# Patient Record
Sex: Female | Born: 1965 | Race: White | Hispanic: No | Smoking: Never smoker
Health system: Southern US, Community
[De-identification: ages and names within clinical notes are randomized; demographics above are authoritative.]

## PROBLEM LIST (undated history)

## (undated) DIAGNOSIS — N189 Chronic kidney disease, unspecified: Secondary | ICD-10-CM

## (undated) DIAGNOSIS — F419 Anxiety disorder, unspecified: Secondary | ICD-10-CM

## (undated) DIAGNOSIS — I639 Cerebral infarction, unspecified: Secondary | ICD-10-CM

## (undated) DIAGNOSIS — I10 Essential (primary) hypertension: Secondary | ICD-10-CM

## (undated) DIAGNOSIS — I2510 Atherosclerotic heart disease of native coronary artery without angina pectoris: Secondary | ICD-10-CM

## (undated) DIAGNOSIS — E669 Obesity, unspecified: Secondary | ICD-10-CM

## (undated) DIAGNOSIS — E119 Type 2 diabetes mellitus without complications: Secondary | ICD-10-CM

## (undated) DIAGNOSIS — I219 Acute myocardial infarction, unspecified: Secondary | ICD-10-CM

## (undated) HISTORY — PX: FISTULOGRAM: SHX5832

## (undated) HISTORY — PX: KIDNEY SURGERY: SHX687

## (undated) HISTORY — PX: PERITONEAL CATHETER INSERTION: SHX2223

## (undated) HISTORY — PX: CORONARY ANGIOPLASTY: SHX604

## (undated) HISTORY — PX: AV FISTULA PLACEMENT: SHX1204

## (undated) HISTORY — PX: CHOLECYSTECTOMY: SHX55

---

## 2008-12-27 HISTORY — PX: CORONARY ARTERY BYPASS GRAFT: SHX141

## 2020-01-25 ENCOUNTER — Other Ambulatory Visit: Payer: Self-pay | Admitting: Nephrology

## 2020-01-25 DIAGNOSIS — D582 Other hemoglobinopathies: Secondary | ICD-10-CM

## 2020-02-01 ENCOUNTER — Ambulatory Visit
Admission: RE | Admit: 2020-02-01 | Discharge: 2020-02-01 | Disposition: A | Discharge status: Home / Self Care | Payer: Medicare Other | Source: Ambulatory Visit | Attending: Nephrology | Admitting: Nephrology

## 2020-02-01 ENCOUNTER — Other Ambulatory Visit: Payer: Self-pay

## 2020-02-01 DIAGNOSIS — N186 End stage renal disease: Secondary | ICD-10-CM | POA: Diagnosis not present

## 2020-02-01 DIAGNOSIS — D582 Other hemoglobinopathies: Secondary | ICD-10-CM

## 2020-06-09 IMAGING — US US RENAL
1 series · 14 of 25 positions shown · non-contrast
Comparison: None.

CLINICAL DATA: Elevated hemoglobin levels. End-stage renal disease.

EXAM:
RENAL / URINARY TRACT ULTRASOUND COMPLETE

[Series 1: us renal · 0.25mm/px · 14 of 31 slices shown]
[im 1/31]
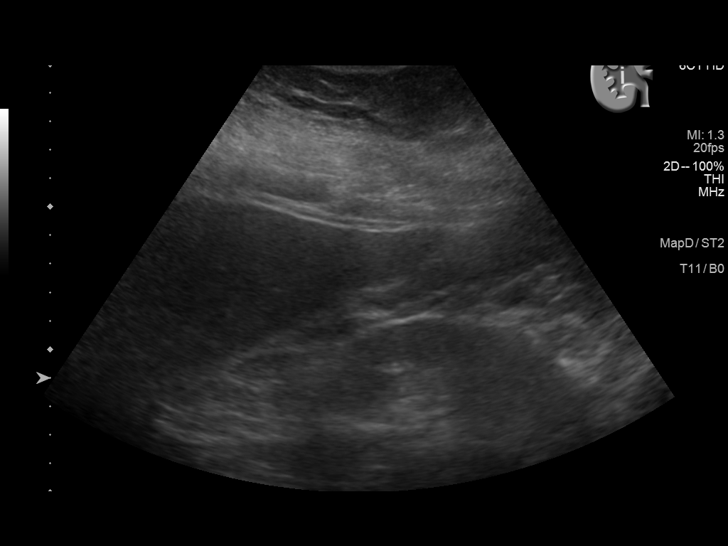
[im 3/31]
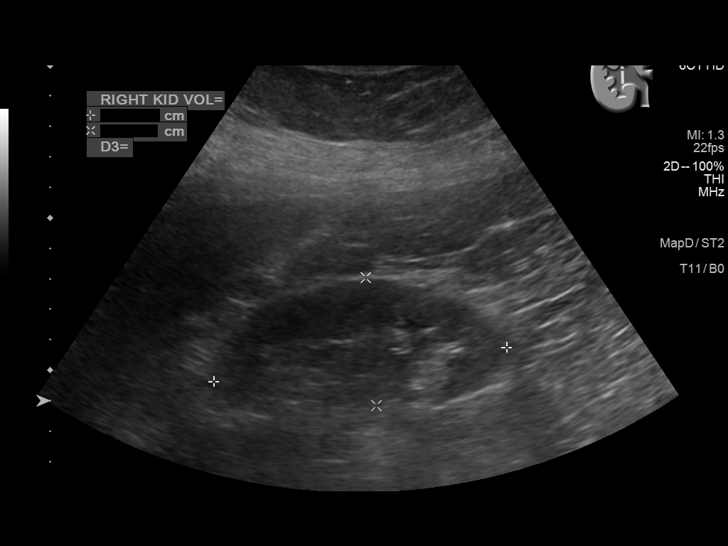
[im 6/31]
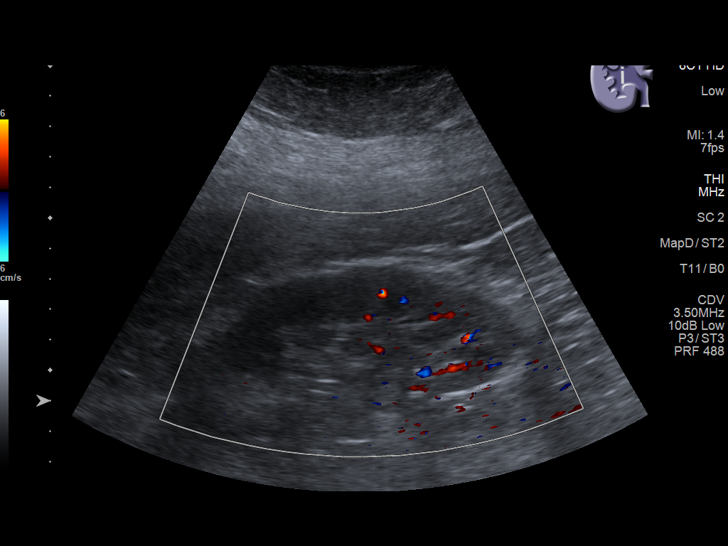
[im 8/31]
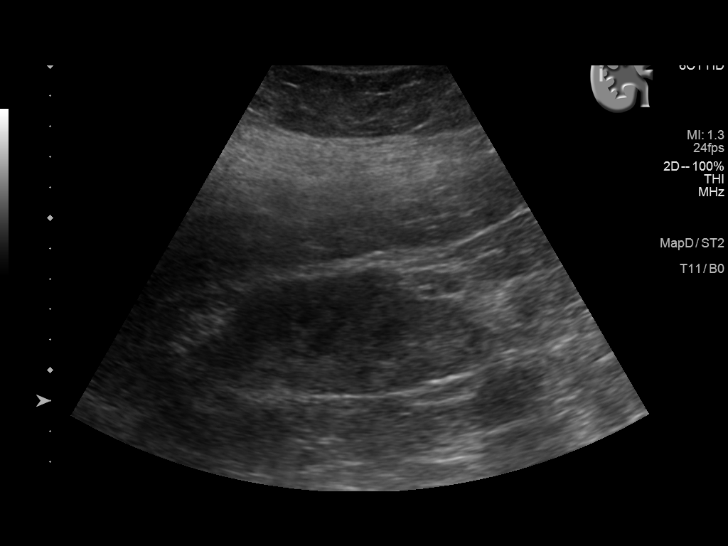
[im 11/31]
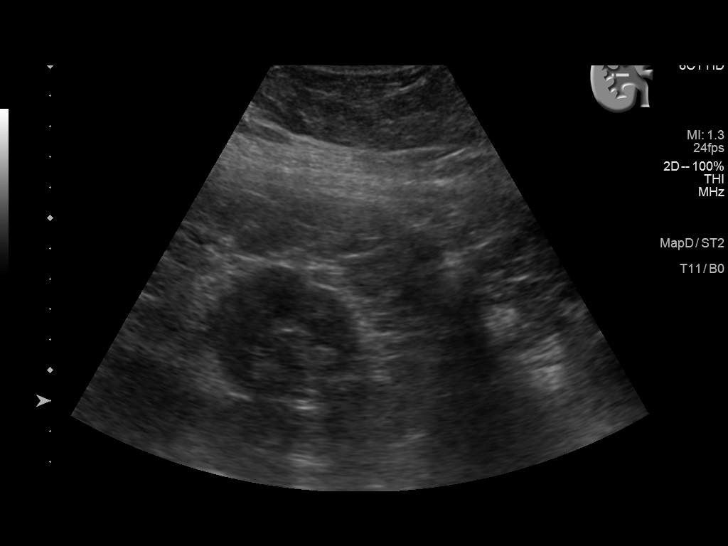
[im 12/31]
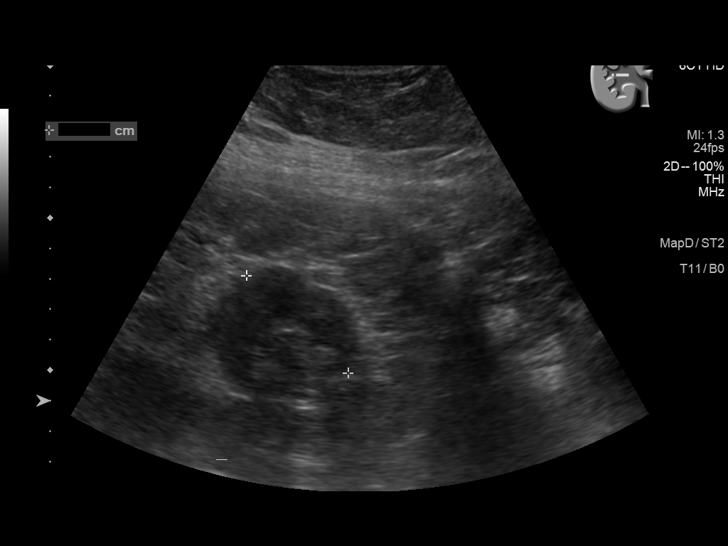
[im 14/31]
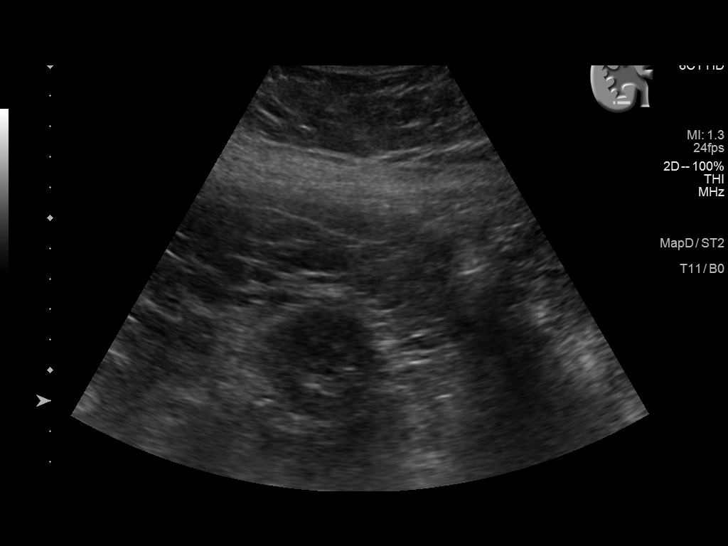
[im 17/31]
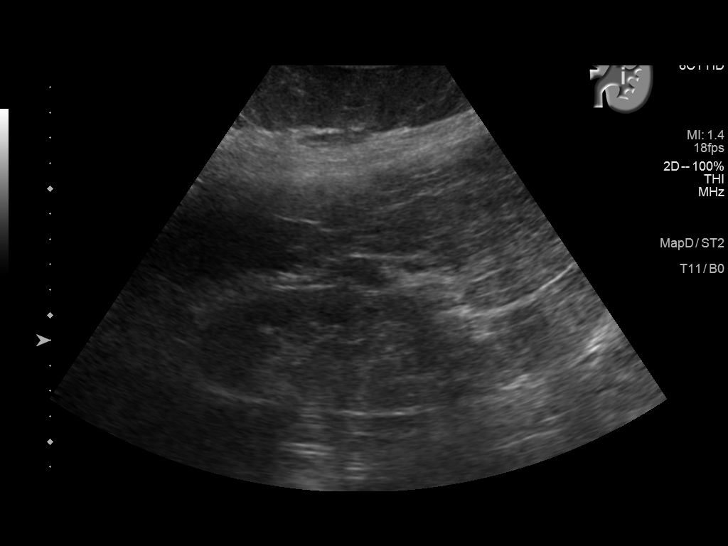
[im 19/31]
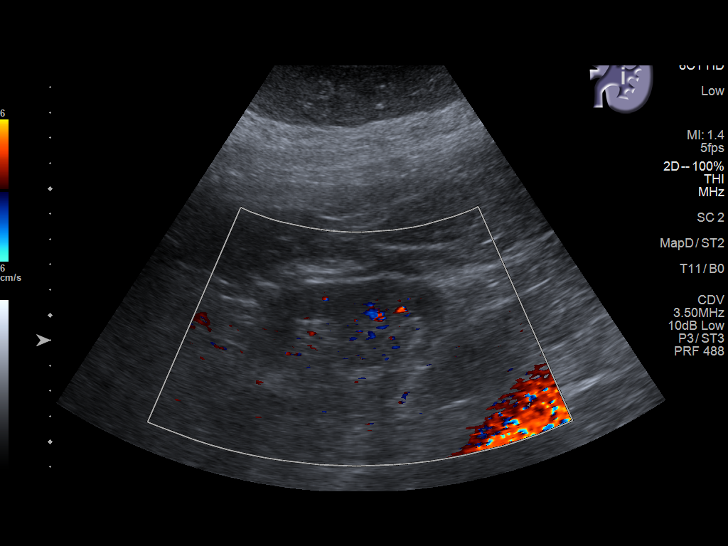
[im 21/31]
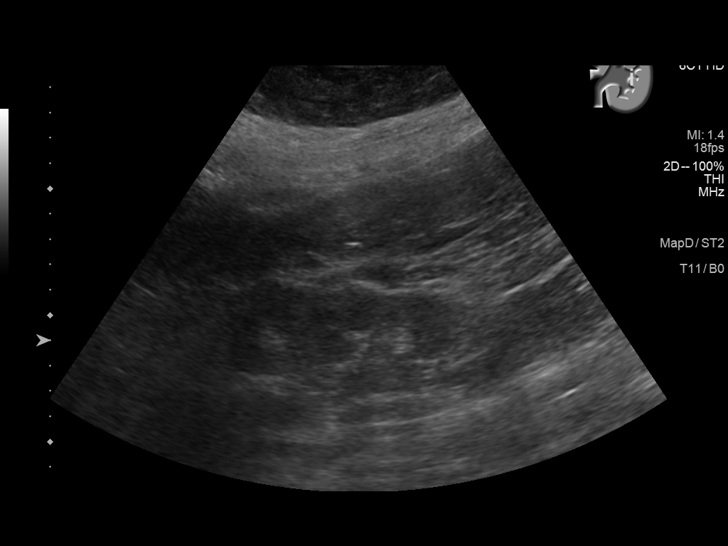
[im 23/31]
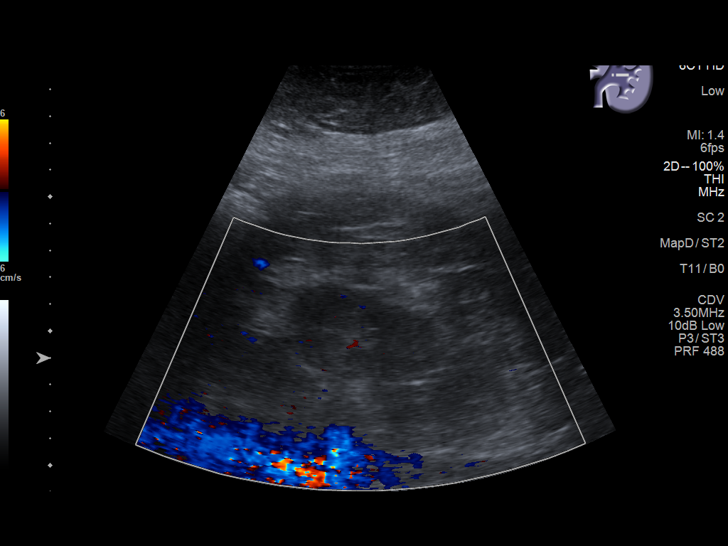
[im 26/31]
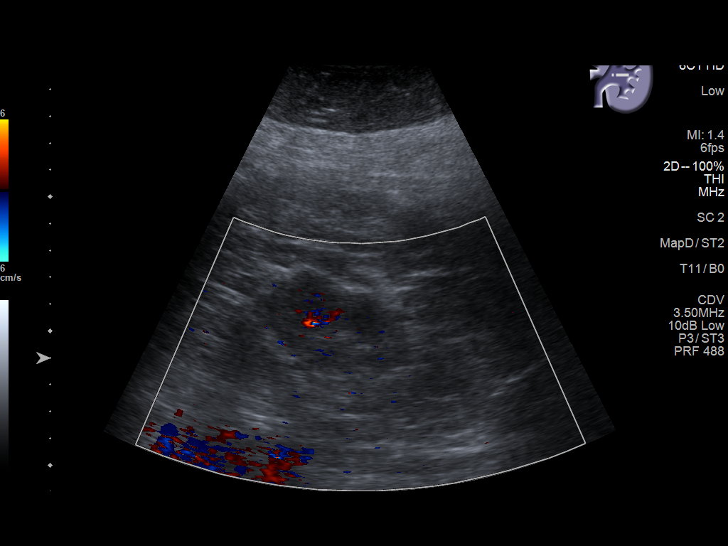
[im 28/31]
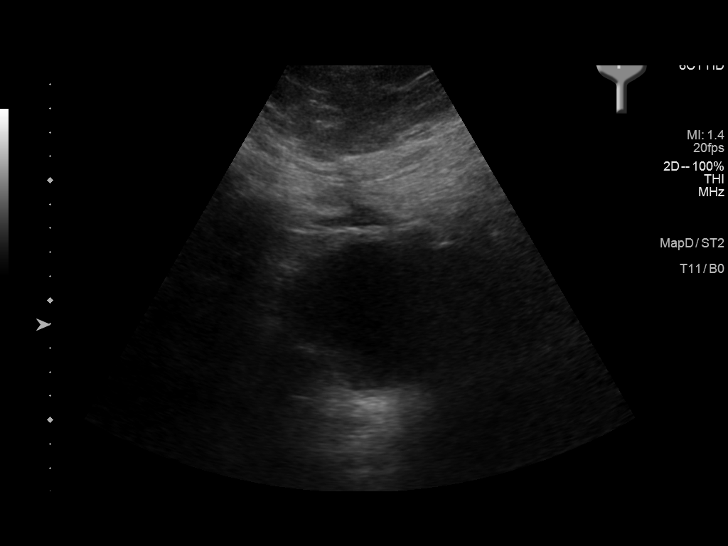
[im 31/31]
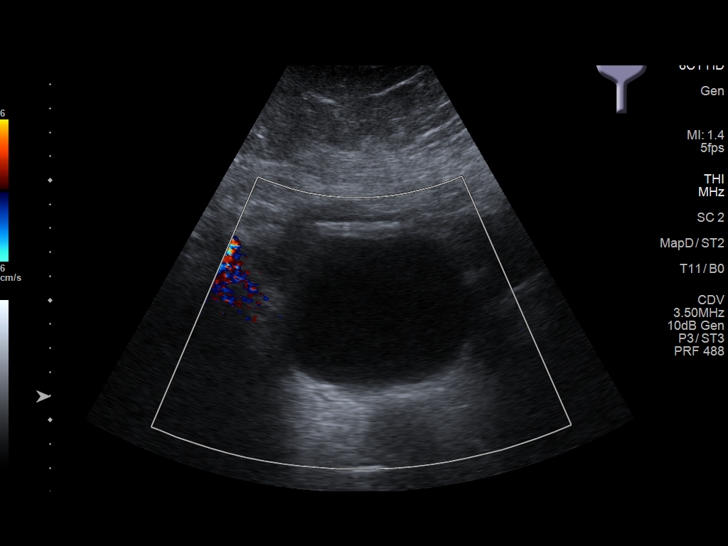

[14 of 25 positions shown; findings below may reference images not displayed]

FINDINGS: Right Kidney:

Renal measurements: 9.7 x 4.2 x 4.6 cm = volume: 99 mL .
Echogenicity within normal limits. No mass or hydronephrosis
visualized.

Left Kidney:

Renal measurements: 10.1 x 4.4 x 3.6 cm = volume: 83 mL.
Echogenicity within normal limits. No mass or hydronephrosis
visualized.

Bladder:

Appears normal for degree of bladder distention. Ureteral jets were
not identified.

Other:

None.
IMPRESSION: Normal appearing kidneys and bladder. Specifically, no renal masses.

## 2021-01-10 ENCOUNTER — Ambulatory Visit
Admission: EM | Admit: 2021-01-10 | Discharge: 2021-01-10 | Disposition: A | Discharge status: Home / Self Care | Payer: Medicare Other | Attending: Family Medicine | Admitting: Family Medicine

## 2021-01-10 ENCOUNTER — Other Ambulatory Visit: Payer: Self-pay

## 2021-01-10 DIAGNOSIS — Z20822 Contact with and (suspected) exposure to covid-19: Secondary | ICD-10-CM | POA: Diagnosis not present

## 2021-01-10 DIAGNOSIS — J069 Acute upper respiratory infection, unspecified: Secondary | ICD-10-CM | POA: Insufficient documentation

## 2021-01-10 HISTORY — DX: Obesity, unspecified: E66.9

## 2021-01-10 MED ORDER — IPRATROPIUM BROMIDE 0.06 % NA SOLN: 2.0000 | Freq: Four times a day (QID) | NASAL | 0 refills | Status: DC | PRN

## 2021-01-10 MED ORDER — AMOXICILLIN-POT CLAVULANATE 875-125 MG PO TABS: 1.0000 | ORAL_TABLET | Freq: Two times a day (BID) | ORAL | 0 refills | Status: DC

## 2021-01-10 NOTE — Discharge Instructions (Signed)
Self isolate until covid results are back.  We will notify you by phone if it is positive. Your negative results will be sent through your MyChart.    If it is positive you need to isolate from others for a total of 5 days. If no fever for 24 hours without medications, and symptoms improving you may end isolation on day 6, but wear a mask if around any others for an additional 5 days.   Push fluids to ensure adequate hydration and keep secretions thin.  Tylenol as needed for headache.  Nasal spray as provided as needed.  Hold antibiotic provided- if your covid is negative and persistent or worsening symptoms early next week you may fill this and complete.  Please return for any worsening of symptoms- shortness of breath , chest pain or fevers.

## 2021-01-10 NOTE — ED Provider Notes (Signed)
[facility]    CSN: [id number] Arrival date & time: 01/10/21  9678      History   Chief Complaint Chief Complaint  Patient presents with  . Cough  . Bodyaches  . Headache    HPI Tiffany Holt is a 55 y.o. female.   Tiffany Holt presents with her sone with complaints of sneezing. Headaches. body aches. Fatigue. Fevers. Started 1 week ago. Worsening. Congestion, sneezing and headache are worse. Fevers have been subjective. No shortness of breath . No chest pain . Cough is non productive. Hasn't taken any medications for symptoms. She has been around her niece who has had a URI, although not tested for covid-19. No other specific known exposures to covid-19.  Nausea and diarrhea nitially- this has resolved. Has never had covid, she has been vaccinated for covid. Diabetic, cardiac history as well as kidney disease.    ROS per HPI, negative if not otherwise mentioned.         Past Medical History:  Diagnosis Date  . Obesity     There are no problems to display for this patient.   History reviewed. No pertinent surgical history.  OB History   No obstetric history on file.      Home Medications    Prior to Admission medications   Medication Sig Start Date End Date Taking? Authorizing Provider  amoxicillin-clavulanate (AUGMENTIN) 875-125 MG tablet Take 1 tablet by mouth every 12 (twelve) hours. 01/10/21  Yes Lakeeta Dobosz, Lanelle Bal B, NP  atorvastatin (LIPITOR) 10 MG tablet Take 1 tablet by mouth daily. 05/08/20  Yes [provider]  buPROPion (WELLBUTRIN XL) 300 MG 24 hr tablet Take 1 tablet by mouth daily. 03/06/20  Yes [provider]  busPIRone (BUSPAR) 5 MG tablet Take by mouth. 04/08/20 11/13/21 Yes [provider]  clopidogrel (PLAVIX) 75 MG tablet  11/05/20  Yes [provider]  ipratropium (ATROVENT) 0.06 % nasal spray Place 2 sprays into both nostrils 4 (four) times daily as needed for rhinitis. 01/10/21  Yes Kwane Rohl, Lanelle Bal B,  NP  ALPRAZolam (XANAX) 0.25 MG tablet alprazolam 0.25 mg tablet    [provider]  atorvastatin (LIPITOR) 40 MG tablet atorvastatin 40 mg tablet  TAKE ONE TABLET BY MOUTH ONE TIME DAILY    [provider]  benzonatate (TESSALON) 100 MG capsule benzonatate 100 mg capsule  TAKE ONE CAPSULE BY MOUTH EVERY 8 HOURS FOR COUGH    [provider]  bumetanide (BUMEX) 2 MG tablet bumetanide 2 mg tablet    [provider]  HYDROcodone-acetaminophen (NORCO) 7.5-325 MG tablet hydrocodone 7.5 mg-acetaminophen 325 mg tablet    [provider]    Family History History reviewed. No pertinent family history.  Social History Social History   Tobacco Use  . Smoking status: Never Smoker  . Smokeless tobacco: Never Used     Allergies   No known allergies   Review of Systems Review of Systems   Physical Exam Triage Vital Signs ED Triage Vitals  Enc Vitals Group     BP 01/10/21 1005 128/74     Pulse Rate 01/10/21 1005 73     Resp 01/10/21 1005 20     Temp 01/10/21 1005 98.3 F (36.8 C)     Temp Source 01/10/21 1005 Oral     SpO2 01/10/21 1005 96 %     Weight --      Height --      Head Circumference --      Peak  Flow --      Pain Score 01/10/21 1003 0     Pain Loc --      Pain Edu? --      Excl. in [place]? --    No data found.  Updated Vital Signs BP 128/74 (BP Location: Left Arm)   Pulse 73   Temp 98.3 F (36.8 C) (Oral)   Resp 20   SpO2 96%   Visual Acuity Right Eye Distance:   Left Eye Distance:   Bilateral Distance:    Right Eye Near:   Left Eye Near:    Bilateral Near:     Physical Exam Constitutional:      General: She is not in acute distress.    Appearance: She is well-developed.  Cardiovascular:     Rate and Rhythm: Normal rate.  Pulmonary:     Effort: Pulmonary effort is normal. No respiratory distress.     Breath sounds: Normal breath sounds. No wheezing.  Skin:    General: Skin is warm and dry.   Neurological:     Mental Status: She is alert and oriented to person, place, and time.      UC Treatments / Results  Labs (all labs ordered are listed, but only abnormal results are displayed) Labs Reviewed  SARS CORONAVIRUS 2 (TAT 6-24 HRS)    EKG   Radiology No results found.  Procedures Procedures (including critical care time)  Medications Ordered in UC Medications - No data to display  Initial Impression / Assessment and Plan / UC Course  I have reviewed the triage vital signs and the nursing notes.  Pertinent labs & imaging results that were available during my care of the patient were reviewed by me and considered in my medical decision making (see chart for details).     Non toxic. Benign physical exam.  Lungs are clear today. Vitals are stable. No work of breathing. Extensive medical history. Have and hold antibiotics provided, pending covid testing. Return precautions provided. Patient verbalized understanding and agreeable to plan.   Final Clinical Impressions(s) / UC Diagnoses   Final diagnoses:  Upper respiratory tract infection, unspecified type     Discharge Instructions     Self isolate until covid results are back.  We will notify you by phone if it is positive. Your negative results will be sent through your MyChart.    If it is positive you need to isolate from others for a total of 5 days. If no fever for 24 hours without medications, and symptoms improving you may end isolation on day 6, but wear a mask if around any others for an additional 5 days.   Push fluids to ensure adequate hydration and keep secretions thin.  Tylenol as needed for headache.  Nasal spray as provided as needed.  Hold antibiotic provided- if your covid is negative and persistent or worsening symptoms early next week you may fill this and complete.  Please return for any worsening of symptoms- shortness of breath , chest pain or fevers.     ED Prescriptions     Medication Sig Dispense Auth. Provider   amoxicillin-clavulanate (AUGMENTIN) 875-125 MG tablet Take 1 tablet by mouth every 12 (twelve) hours. 14 tablet Augusto Gamble B, NP   ipratropium (ATROVENT) 0.06 % nasal spray Place 2 sprays into both nostrils 4 (four) times daily as needed for rhinitis. 15 mL Zigmund Gottron, NP     PDMP not reviewed this encounter.   Zigmund Gottron, NP 01/10/21  1112  

## 2021-01-10 NOTE — ED Triage Notes (Signed)
Pt is here with a headache, bodyaches and a cough that started 10 days ago, pt has not taken any meds to relieve discomfort.

## 2021-01-11 LAB — SARS CORONAVIRUS 2 (TAT 6-24 HRS): SARS Coronavirus 2: NEGATIVE

## 2021-03-05 ENCOUNTER — Telehealth (INDEPENDENT_AMBULATORY_CARE_PROVIDER_SITE_OTHER): Payer: Self-pay

## 2021-03-05 ENCOUNTER — Other Ambulatory Visit: Payer: Self-pay | Admitting: Internal Medicine

## 2021-03-05 NOTE — Telephone Encounter (Signed)
Spoke with the patient and she is scheduled with Dr. Lucky Cowboy for a permcath insertion on 03/09/21 with a 12:30 pm arrival time to the [facility]. Covid testing on 03/06/21 between 8-1 pm. Pre-procedure instructions were discussed and will be mailed.

## 2021-03-06 ENCOUNTER — Other Ambulatory Visit: Payer: Self-pay

## 2021-03-06 ENCOUNTER — Other Ambulatory Visit
Admission: RE | Admit: 2021-03-06 | Discharge: 2021-03-06 | Disposition: A | Discharge status: Home / Self Care | Payer: Medicare Other | Source: Ambulatory Visit | Attending: Vascular Surgery | Admitting: Vascular Surgery

## 2021-03-06 DIAGNOSIS — Z01812 Encounter for preprocedural laboratory examination: Secondary | ICD-10-CM | POA: Insufficient documentation

## 2021-03-06 DIAGNOSIS — Z20822 Contact with and (suspected) exposure to covid-19: Secondary | ICD-10-CM | POA: Insufficient documentation

## 2021-03-06 LAB — SARS CORONAVIRUS 2 (TAT 6-24 HRS): SARS Coronavirus 2: NEGATIVE

## 2021-03-08 ENCOUNTER — Other Ambulatory Visit (INDEPENDENT_AMBULATORY_CARE_PROVIDER_SITE_OTHER): Payer: Self-pay | Admitting: Nurse Practitioner

## 2021-03-09 ENCOUNTER — Other Ambulatory Visit: Payer: Self-pay

## 2021-03-09 ENCOUNTER — Encounter: Payer: Self-pay | Admitting: Vascular Surgery

## 2021-03-09 ENCOUNTER — Ambulatory Visit
Admission: RE | Admit: 2021-03-09 | Discharge: 2021-03-09 | Disposition: A | Discharge status: Home / Self Care | Payer: Medicare Other | Attending: Vascular Surgery | Admitting: Vascular Surgery

## 2021-03-09 ENCOUNTER — Encounter
Admission: RE | Disposition: A | Discharge status: Home / Self Care | Payer: Self-pay | Source: Home / Self Care | Attending: Vascular Surgery

## 2021-03-09 DIAGNOSIS — N186 End stage renal disease: Secondary | ICD-10-CM | POA: Insufficient documentation

## 2021-03-09 DIAGNOSIS — Z992 Dependence on renal dialysis: Secondary | ICD-10-CM | POA: Diagnosis not present

## 2021-03-09 DIAGNOSIS — I252 Old myocardial infarction: Secondary | ICD-10-CM | POA: Diagnosis not present

## 2021-03-09 DIAGNOSIS — E1122 Type 2 diabetes mellitus with diabetic chronic kidney disease: Secondary | ICD-10-CM | POA: Insufficient documentation

## 2021-03-09 DIAGNOSIS — I120 Hypertensive chronic kidney disease with stage 5 chronic kidney disease or end stage renal disease: Secondary | ICD-10-CM | POA: Insufficient documentation

## 2021-03-09 DIAGNOSIS — Z8673 Personal history of transient ischemic attack (TIA), and cerebral infarction without residual deficits: Secondary | ICD-10-CM | POA: Insufficient documentation

## 2021-03-09 DIAGNOSIS — E785 Hyperlipidemia, unspecified: Secondary | ICD-10-CM | POA: Diagnosis not present

## 2021-03-09 DIAGNOSIS — T82898A Other specified complication of vascular prosthetic devices, implants and grafts, initial encounter: Secondary | ICD-10-CM

## 2021-03-09 HISTORY — DX: Cerebral infarction, unspecified: I63.9

## 2021-03-09 HISTORY — DX: Atherosclerotic heart disease of native coronary artery without angina pectoris: I25.10

## 2021-03-09 HISTORY — PX: DIALYSIS/PERMA CATHETER INSERTION: CATH118288

## 2021-03-09 HISTORY — DX: Chronic kidney disease, unspecified: N18.9

## 2021-03-09 HISTORY — DX: Type 2 diabetes mellitus without complications: E11.9

## 2021-03-09 HISTORY — DX: Acute myocardial infarction, unspecified: I21.9

## 2021-03-09 HISTORY — DX: Anxiety disorder, unspecified: F41.9

## 2021-03-09 HISTORY — DX: Essential (primary) hypertension: I10

## 2021-03-09 LAB — GLUCOSE, CAPILLARY: Glucose-Capillary: 136 mg/dL — ABNORMAL HIGH (ref 70–99)

## 2021-03-09 SURGERY — DIALYSIS/PERMA CATHETER INSERTION
Anesthesia: Moderate Sedation

## 2021-03-09 MED ORDER — SODIUM CHLORIDE 0.9 % IV SOLN: INTRAVENOUS | Status: DC

## 2021-03-09 MED ORDER — CEFAZOLIN SODIUM-DEXTROSE 1-4 GM/50ML-% IV SOLN: 1.0000 g | Freq: Once | INTRAVENOUS | Status: AC

## 2021-03-09 MED ORDER — MIDAZOLAM HCL 5 MG/5ML IJ SOLN
INTRAMUSCULAR | Status: AC
  Filled 2021-03-09: qty 5

## 2021-03-09 MED ORDER — HYDROMORPHONE HCL 1 MG/ML IJ SOLN: 1.0000 mg | Freq: Once | INTRAMUSCULAR | Status: DC | PRN

## 2021-03-09 MED ORDER — MIDAZOLAM HCL 2 MG/ML PO SYRP: 8.0000 mg | ORAL_SOLUTION | Freq: Once | ORAL | Status: DC | PRN

## 2021-03-09 MED ORDER — MIDAZOLAM HCL 2 MG/2ML IJ SOLN
INTRAMUSCULAR | Status: DC | PRN
  Administered 2021-03-09: 1 mg via INTRAVENOUS
  Administered 2021-03-09: 2 mg via INTRAVENOUS

## 2021-03-09 MED ORDER — ONDANSETRON HCL 4 MG/2ML IJ SOLN: 4.0000 mg | Freq: Four times a day (QID) | INTRAMUSCULAR | Status: DC | PRN

## 2021-03-09 MED ORDER — DIPHENHYDRAMINE HCL 50 MG/ML IJ SOLN: 50.0000 mg | Freq: Once | INTRAMUSCULAR | Status: DC | PRN

## 2021-03-09 MED ORDER — METHYLPREDNISOLONE SODIUM SUCC 125 MG IJ SOLR: 125.0000 mg | Freq: Once | INTRAMUSCULAR | Status: DC | PRN

## 2021-03-09 MED ORDER — CEFAZOLIN SODIUM-DEXTROSE 1-4 GM/50ML-% IV SOLN
INTRAVENOUS | Status: AC
  Administered 2021-03-09: 1 g via INTRAVENOUS
  Filled 2021-03-09: qty 50

## 2021-03-09 MED ORDER — HEPARIN SODIUM (PORCINE) 1000 UNIT/ML IJ SOLN
INTRAMUSCULAR | Status: AC
  Filled 2021-03-09: qty 1

## 2021-03-09 MED ORDER — FENTANYL CITRATE (PF) 100 MCG/2ML IJ SOLN
INTRAMUSCULAR | Status: DC | PRN
  Administered 2021-03-09: 25 ug via INTRAVENOUS
  Administered 2021-03-09: 50 ug via INTRAVENOUS

## 2021-03-09 MED ORDER — HEPARIN SODIUM (PORCINE) 10000 UNIT/ML IJ SOLN
INTRAMUSCULAR | Status: AC
  Filled 2021-03-09: qty 1

## 2021-03-09 MED ORDER — FENTANYL CITRATE (PF) 100 MCG/2ML IJ SOLN
INTRAMUSCULAR | Status: AC
  Filled 2021-03-09: qty 2

## 2021-03-09 MED ORDER — FAMOTIDINE 20 MG PO TABS: 40.0000 mg | ORAL_TABLET | Freq: Once | ORAL | Status: DC | PRN

## 2021-03-09 SURGICAL SUPPLY — 8 items
CATH CANNON HEMO 15FR 19 (HEMODIALYSIS SUPPLIES) IMPLANT
CATH CANNON HEMO 15FR 23CM (HEMODIALYSIS SUPPLIES) ×2 IMPLANT
COVER PROBE U/S 5X48 (MISCELLANEOUS) ×4 IMPLANT
DERMABOND ADVANCED (GAUZE/BANDAGES/DRESSINGS) ×1
DERMABOND ADVANCED .7 DNX12 (GAUZE/BANDAGES/DRESSINGS) ×1 IMPLANT
PACK ANGIOGRAPHY (CUSTOM PROCEDURE TRAY) ×2 IMPLANT
SUT MNCRL AB 4-0 PS2 18 (SUTURE) ×2 IMPLANT
SUT PROLENE 0 CT 1 30 (SUTURE) ×2 IMPLANT

## 2021-03-09 NOTE — Op Note (Signed)
OPERATIVE NOTE    PRE-OPERATIVE DIAGNOSIS: 1. ESRD 2. Failing PD catheter  POST-OPERATIVE DIAGNOSIS: same as above  PROCEDURE: 1. Ultrasound guidance for vascular access to the left internal jugular vein 2. Fluoroscopic guidance for placement of catheter 3. Placement of a 23 cm tip to cuff tunneled hemodialysis catheter via the left internal jugular vein  SURGEON: Leotis Pain, MD  ANESTHESIA:  Local with Moderate conscious sedation for approximately 30 minutes using 3 mg of Versed and 75 mcg of Fentanyl  ESTIMATED BLOOD LOSS: 5 cc  FLUORO TIME: less than one minute  CONTRAST: none  FINDING(S): 1.  Patent left internal jugular vein  SPECIMEN(S):  None  INDICATIONS:   Tiffany Holt is a 55 y.o. female who presents with renal failure and failure of PD.  The patient needs long term dialysis access for their ESRD, and a Permcath is necessary.  Risks and benefits are discussed and informed consent is obtained.    DESCRIPTION: After obtaining full informed written consent, the patient was brought back to the vascular suited. The patient's left neck and chest were sterilely prepped and draped in a sterile surgical field was created. Moderate conscious sedation was administered during a face to face encounter with the patient throughout the procedure with my supervision of the RN administering medicines and monitoring the patient's vital signs, pulse oximetry, telemetry and mental status throughout from the start of the procedure until the patient was taken to the recovery room.  Initially, we evaluated the right internal jugular vein but I did not see a patent right internal jugular vein to use for access.  I turned my attention to the left neck and we reprepped and draped.  The left internal jugular vein was visualized with ultrasound and found to be patent. It was then accessed under direct ultrasound guidance and a permanent image was recorded. A wire was placed. After skin nick and  dilatation, the peel-away sheath was placed over the wire. I then turned my attention to an area under the clavicle. Approximately 1-2 fingerbreadths below the clavicle a small counterincision was created and tunneled from the subclavicular incision to the access site. Using fluoroscopic guidance, a 23 centimeter tip to cuff tunneled hemodialysis catheter was selected, and tunneled from the subclavicular incision to the access site. It was then placed through the peel-away sheath and the peel-away sheath was removed. Using fluoroscopic guidance the catheter tips were parked in the right atrium. The appropriate distal connectors were placed. It withdrew blood well and flushed easily with heparinized saline and a concentrated heparin solution was then placed. It was secured to the chest wall with 2 Prolene sutures. The access incision was closed single 4-0 Monocryl. A 4-0 Monocryl pursestring suture was placed around the exit site. Sterile dressings were placed. The patient tolerated the procedure well and was taken to the recovery room in stable condition.  COMPLICATIONS: None  CONDITION: Stable  Leotis Pain  03/09/2021, 2:19 PM   This note was created with Dragon Medical transcription system. Any errors in dictation are purely unintentional.

## 2021-03-09 NOTE — Discharge Instructions (Signed)
Tunneled Catheter Insertion, Care After This sheet gives you information about how to care for yourself after your procedure. Your health care provider may also give you more specific instructions. If you have problems or questions, contact your health care provider. What can I expect after the procedure? After the procedure, it is common to have:  Some mild redness, bruising, swelling, and pain around your catheter site.  A small amount of blood or clear fluid coming from your incisions. Follow these instructions at home: Incision care  Follow instructions from your health care provider about how to take care of your incisions. Make sure you: ? Wash your hands with soap and water before and after you change your bandages (dressings). If soap and water are not available, use hand sanitizer. ? Change your dressings as told by your health care provider. Wash the area around your incisions with a germ-killing (antiseptic) solution when you change your dressings. ? Leave stitches (sutures), skin glue, or adhesive strips in place. These skin closures may need to stay in place for 2 weeks or longer. If adhesive strip edges start to loosen and curl up, you may trim the loose edges. Do not remove adhesive strips completely unless your health care provider tells you to do that.  Keep your dressings clean and dry.  Check your incision areas every day for signs of infection. Check for: ? More redness, swelling, or pain. ? More fluid or blood. ? Warmth. ? Pus or a bad smell.   Catheter care  Wash your hands with soap and water before and after caring for your catheter. If soap and water are not available, use hand sanitizer.  Keep your catheter site clean and dry.  Apply an antibiotic ointment to your catheter site as told by your health care provider.  Flush your catheter as told by your health care provider. This helps prevent it from becoming clogged.  Do not open the caps on the ends of the  catheter.  Do not pull on your catheter.   Medicines  Take over-the-counter and prescription medicines only as told by your health care provider.  If you were prescribed an antibiotic medicine, take it as told by your health care provider. Do not stop taking the antibiotic even if you start to feel better. Activity  Return to your normal activities as told by your health care provider. Ask your health care provider what activities are safe for you.  Follow any other activity restrictions as instructed by your health care provider.  Do not lift anything that is heavier than 10 lb (4.5 kg), or the limit that you are told, until your health care provider says that it is safe. Driving  Do not drive until your health care provider approves.  Ask your health care provider if the medicine prescribed to you requires you to avoid driving or using heavy machinery. General instructions  Follow your health care provider's specific instructions for the type of catheter that you have.  Do not take baths, swim, or use a hot tub until your health care provider approves. Ask your health care provider if you may take showers.  Keep all follow-up visits as told by your health care provider. This is important. Contact a health care provider if:  You feel unusually weak or nauseous.  You have more redness, swelling, or pain at your incisions or around the area where your catheter is inserted.  Your catheter is not working properly.  You are unable to flush  your catheter. Get help right away if:  Your catheter develops a hole or it breaks.  You have pain or swelling when fluids or medicines are being given through the catheter.  Fluid is leaking from the catheter, under the dressing, or around the dressing.  Your catheter comes loose or gets pulled completely out. If this happens, press on your catheter site firmly with a clean cloth until you can get medical help.  You have swelling in your  shoulder, neck, chest, or face.  You have chest pain or difficulty breathing.  You feel dizzy or light-headed.  You have pus or a bad smell coming from your catheter site.  You have a fever or chills.  Your catheter site feels warm to the touch.  You develop bleeding from your catheter or your insertion site, and your bleeding does not stop. Summary  After the procedure, it is common to have mild redness, swelling, and pain around your catheter site.  Return to your normal activities as told by your health care provider. Ask your health care provider what activities are safe for you.  Follow your health care provider's specific instructions for the type of catheter that you have.  Keep your catheter site and your dressings clean and dry.  Contact a health care provider if your catheter is not working properly. Get help right away if you have chest pain, fever, or difficulty breathing. This information is not intended to replace advice given to you by your health care provider. Make sure you discuss any questions you have with your health care provider. Document Revised: 12/05/2018 Document Reviewed: 12/05/2018 Elsevier Patient Education  2021 [employer].

## 2021-03-09 NOTE — H&P (Signed)
[facility] Admission History & Physical  MRN : [id number]  Tiffany Holt is a 55 y.o. (15-May-1966) female who presents with chief complaint of scheduled PermCath insertion.  History of Present Illness: Tiffany Holt is a 55 year old female with a history of HTN, HLD, CVA, MI, DM and ESRD who presents today for a scheduled PermCath insertion.  As per EPIC notation, the patient receives her vascular surgery care from [facility], and seems to be undergoing a peritoneal dialysis catheter removal at [facility] on March 21.   Patient presents today with a complaint.  Denies any fever, nausea vomiting.  Any shortness of breath or chest pain.  Current Facility-Administered Medications  Medication Dose Route Frequency Provider Last Rate Last Admin  . heparin 10000 UNIT/ML injection           . 0.9 %  sodium chloride infusion   Intravenous Continuous Kris Hartmann, NP 10 mL/hr at 03/09/21 1329 New Bag at 03/09/21 1329  . ceFAZolin (ANCEF) 1-4 GM/50ML-% IVPB           . ceFAZolin (ANCEF) IVPB 1 g/50 mL premix  1 g Intravenous Once Eulogio Ditch E, NP      . diphenhydrAMINE (BENADRYL) injection 50 mg  50 mg Intravenous Once PRN Kris Hartmann, NP      . famotidine (PEPCID) tablet 40 mg  40 mg Oral Once PRN Kris Hartmann, NP      . fentaNYL (SUBLIMAZE) 100 MCG/2ML injection           . HYDROmorphone (DILAUDID) injection 1 mg  1 mg Intravenous Once PRN Eulogio Ditch E, NP      . methylPREDNISolone sodium succinate (SOLU-MEDROL) 125 mg/2 mL injection 125 mg  125 mg Intravenous Once PRN Eulogio Ditch E, NP      . midazolam (VERSED) 2 MG/ML syrup 8 mg  8 mg Oral Once PRN Kris Hartmann, NP      . midazolam (VERSED) 5 MG/5ML injection           . ondansetron (ZOFRAN) injection 4 mg  4 mg Intravenous Q6H PRN Kris Hartmann, NP       Past Medical History:  Diagnosis Date  . Anxiety   . Chronic kidney disease   . Coronary artery disease   . Diabetes mellitus without complication ([place])    . Hypertension   . Myocardial infarction ([place])   . Obesity   . Stroke [facility])    Past Surgical History:  Procedure Laterality Date  . AV FISTULA PLACEMENT    . CHOLECYSTECTOMY    . CORONARY ANGIOPLASTY    . CORONARY ARTERY BYPASS GRAFT  2010  . FISTULOGRAM    . KIDNEY SURGERY    . PERITONEAL CATHETER INSERTION     Social History   Tobacco Use  . Smoking status: Never Smoker  . Smokeless tobacco: Never Used   History reviewed. No pertinent family history.  Denies family history of peripheral artery disease, venous disease or renal disease.  Allergies  Allergen Reactions  . No Known Allergies    REVIEW OF SYSTEMS (Negative unless checked)  Constitutional: '[]'$ Weight loss  '[]'$ Fever  '[]'$ Chills Cardiac: '[]'$ Chest pain   '[]'$ Chest pressure   '[]'$ Palpitations   '[]'$ Shortness of breath when laying flat   '[]'$ Shortness of breath at rest   '[]'$ Shortness of breath with exertion. Vascular:  '[]'$ Pain in legs with walking   '[]'$ Pain in legs at rest   '[]'$ Pain in legs when laying flat   '[]'$   Claudication   '[]'$ Pain in feet when walking  '[]'$ Pain in feet at rest  '[]'$ Pain in feet when laying flat   '[]'$ History of DVT   '[]'$ Phlebitis   '[x]'$ Swelling in legs   '[]'$ Varicose veins   '[]'$ Non-healing ulcers Pulmonary:   '[]'$ Uses home oxygen   '[]'$ Productive cough   '[]'$ Hemoptysis   '[]'$ Wheeze  '[]'$ COPD   '[]'$ Asthma Neurologic:  '[]'$ Dizziness  '[]'$ Blackouts   '[]'$ Seizures   '[]'$ History of stroke   '[]'$ History of TIA  '[]'$ Aphasia   '[]'$ Temporary blindness   '[]'$ Dysphagia   '[]'$ Weakness or numbness in arms   '[]'$ Weakness or numbness in legs Musculoskeletal:  '[]'$ Arthritis   '[]'$ Joint swelling   '[]'$ Joint pain   '[]'$ Low back pain Hematologic:  '[]'$ Easy bruising  '[]'$ Easy bleeding   '[]'$ Hypercoagulable state   '[x]'$ Anemic  '[]'$ Hepatitis Gastrointestinal:  '[]'$ Blood in stool   '[]'$ Vomiting blood  '[]'$ Gastroesophageal reflux/heartburn   '[]'$ Difficulty swallowing. Genitourinary:  '[x]'$ Chronic kidney disease   '[]'$ Difficult urination  '[]'$ Frequent urination  '[]'$ Burning with urination   '[]'$ Blood in urine Skin:   '[]'$ Rashes   '[]'$ Ulcers   '[]'$ Wounds Psychological:  '[]'$ History of anxiety   '[]'$  History of major depression.  Physical Examination  Vitals:   03/09/21 1302  BP: (!) 156/68  Pulse: 73  Resp: 14  Temp: 97.6 F (36.4 C)  TempSrc: Oral  SpO2: 93%  Weight: 136.1 kg  Height: '5\' 6"'$  (1.676 m)   Body mass index is 48.42 kg/m. Gen: WD/WN, NAD Head: [place]/AT, No temporalis wasting. Prominent temp pulse not noted. Ear/Nose/Throat: Hearing grossly intact, nares w/o erythema or drainage, oropharynx w/o Erythema/Exudate,  Eyes: Conjunctiva clear, sclera non-icteric Neck: Trachea midline.  No JVD.  Pulmonary:  Good air movement, respirations not labored, no use of accessory muscles.  Cardiac: RRR, normal S1, S2. Vascular:  Vessel Right Left  Radial Palpable Palpable                                   Gastrointestinal: soft, non-tender/non-distended. No guarding/reflex.  Musculoskeletal: M/S 5/5 throughout.  Extremities without ischemic changes.  No deformity or atrophy.  Neurologic: Sensation grossly intact in extremities.  Symmetrical.  Speech is fluent. Motor exam as listed above. Psychiatric: Judgment intact, Mood & affect appropriate for pt's clinical situation. Dermatologic: No rashes or ulcers noted.  No cellulitis or open wounds. Lymph : No Cervical, Axillary, or Inguinal lymphadenopathy.  CBC No results found for: WBC, HGB, HCT, MCV, PLT  BMET No results found for: NA, K, CL, CO2, GLUCOSE, BUN, CREATININE, CALCIUM, GFRNONAA, GFRAA CrCl cannot be calculated (No successful lab value found.).  COAG No results found for: INR, PROTIME  Radiology No results found.  Assessment/Plan Tiffany Holt is a 55 year old female with a history of HTN, HLD, CVA, MI, DM and ESRD who presents today for a scheduled PermCath insertion.  1.  End-Stage Renal Disease: As per EPIC notation, patient receives vascular care from [facility]. Patient is on the schedule with general surgery from [facility] for  peritoneal dialysis catheter removal / revision?  She presents today for PermCath insertion.  Procedure, risks and benefits were explained to the patient.  All questions were answered.  Patient wished to proceed.  2.  Diabetes: Encouraged good control as its slows the progression of atherosclerotic disease.  3.  Hyperlipidemia: Encouraged good control as its slows the progression of atherosclerotic disease.  Discussed with Dr. Mayme Genta, PA-C  03/09/2021 1:40 PM

## 2021-03-10 ENCOUNTER — Encounter: Payer: Self-pay | Admitting: Vascular Surgery

## 2021-03-23 ENCOUNTER — Other Ambulatory Visit: Payer: Medicare Other

## 2021-03-24 ENCOUNTER — Other Ambulatory Visit
Admission: RE | Admit: 2021-03-24 | Discharge: 2021-03-24 | Disposition: A | Discharge status: Home / Self Care | Payer: Medicare Other | Source: Ambulatory Visit | Attending: Vascular Surgery | Admitting: Vascular Surgery

## 2021-03-24 ENCOUNTER — Other Ambulatory Visit: Payer: Self-pay

## 2021-03-24 ENCOUNTER — Telehealth (INDEPENDENT_AMBULATORY_CARE_PROVIDER_SITE_OTHER): Payer: Self-pay

## 2021-03-24 DIAGNOSIS — Z01812 Encounter for preprocedural laboratory examination: Secondary | ICD-10-CM | POA: Diagnosis present

## 2021-03-24 DIAGNOSIS — Z20822 Contact with and (suspected) exposure to covid-19: Secondary | ICD-10-CM | POA: Diagnosis not present

## 2021-03-24 LAB — SARS CORONAVIRUS 2 (TAT 6-24 HRS): SARS Coronavirus 2: NEGATIVE

## 2021-03-24 NOTE — Telephone Encounter (Signed)
Spoke with the patient and she is scheduled with Dr. Lucky Cowboy for a TPA infusion on 03/26/21 with a 8:00 am arrival time to the [facility]. Covid testing on 03/24/21 between 8-2 pm at the [facility]. Pre-procedure instructions were discussed.

## 2021-03-25 ENCOUNTER — Other Ambulatory Visit (INDEPENDENT_AMBULATORY_CARE_PROVIDER_SITE_OTHER): Payer: Self-pay | Admitting: Nurse Practitioner

## 2021-03-26 ENCOUNTER — Other Ambulatory Visit: Payer: Self-pay

## 2021-03-26 ENCOUNTER — Ambulatory Visit
Admission: RE | Admit: 2021-03-26 | Discharge: 2021-03-26 | Disposition: A | Discharge status: Home / Self Care | Payer: Medicare Other | Source: Ambulatory Visit | Attending: Vascular Surgery | Admitting: Vascular Surgery

## 2021-03-26 ENCOUNTER — Ambulatory Visit: Admit: 2021-03-26 | Payer: Medicare Other | Admitting: Vascular Surgery

## 2021-03-26 DIAGNOSIS — N186 End stage renal disease: Secondary | ICD-10-CM | POA: Diagnosis not present

## 2021-03-26 DIAGNOSIS — T8249XA Other complication of vascular dialysis catheter, initial encounter: Secondary | ICD-10-CM | POA: Insufficient documentation

## 2021-03-26 DIAGNOSIS — Y838 Other surgical procedures as the cause of abnormal reaction of the patient, or of later complication, without mention of misadventure at the time of the procedure: Secondary | ICD-10-CM | POA: Insufficient documentation

## 2021-03-26 LAB — GLUCOSE, CAPILLARY: Glucose-Capillary: 151 mg/dL — ABNORMAL HIGH (ref 70–99)

## 2021-03-26 SURGERY — A/V FISTULAGRAM
Anesthesia: Moderate Sedation

## 2021-03-26 MED ORDER — SODIUM CHLORIDE 0.9 % IV SOLN
5.0000 mg | Freq: Once | INTRAVENOUS | Status: AC
  Administered 2021-03-26: 5 mg via INTRAVENOUS
  Filled 2021-03-26: qty 5

## 2021-03-26 MED ORDER — SODIUM CHLORIDE 0.9 % IV SOLN: INTRAVENOUS | Status: DC

## 2021-03-26 MED ORDER — HEPARIN SODIUM (PORCINE) 5000 UNIT/ML IJ SOLN
INTRAMUSCULAR | Status: AC
  Filled 2021-03-26: qty 1

## 2021-03-26 NOTE — Progress Notes (Signed)
Assumed care of pt.; report received from S. Lelon Huh, RN. TPA infusing bilat. Ports of perm cath. No c/o SOB, CP, dizziness, HA. No bleeding from Left upper chest site.

## 2021-03-26 NOTE — Progress Notes (Signed)
Juanell Fairly, PA in at bedside. PA flushed both ports of Left upper chest perm cath with + blood return on both ports. No complications noted at site. Pt. Stable for DC home with sister escort.

## 2021-05-05 ENCOUNTER — Telehealth (INDEPENDENT_AMBULATORY_CARE_PROVIDER_SITE_OTHER): Payer: Self-pay | Admitting: Vascular Surgery

## 2021-05-05 NOTE — Telephone Encounter (Signed)
Patient called in and left VM stating she needs to schedule an appointment to remove her cath.  Please advise

## 2021-05-06 ENCOUNTER — Telehealth (INDEPENDENT_AMBULATORY_CARE_PROVIDER_SITE_OTHER): Payer: Self-pay

## 2021-05-06 NOTE — Telephone Encounter (Signed)
Spoke with the patient and she is scheduled with Dr. Lucky Cowboy for a permcath removal on 05/11/21 with a 11:45 am arrival time to the MM. Pre-procedure instructions were discussed and will be mailed.

## 2021-05-11 ENCOUNTER — Encounter: Payer: Self-pay | Admitting: Vascular Surgery

## 2021-05-11 ENCOUNTER — Other Ambulatory Visit (INDEPENDENT_AMBULATORY_CARE_PROVIDER_SITE_OTHER): Payer: Self-pay | Admitting: Nurse Practitioner

## 2021-05-11 ENCOUNTER — Ambulatory Visit
Admission: RE | Admit: 2021-05-11 | Discharge: 2021-05-11 | Disposition: A | Discharge status: Home / Self Care | Payer: Medicare Other | Attending: Vascular Surgery | Admitting: Vascular Surgery

## 2021-05-11 ENCOUNTER — Encounter
Admission: RE | Disposition: A | Discharge status: Home / Self Care | Payer: Self-pay | Source: Home / Self Care | Attending: Vascular Surgery

## 2021-05-11 DIAGNOSIS — N185 Chronic kidney disease, stage 5: Secondary | ICD-10-CM

## 2021-05-11 DIAGNOSIS — N186 End stage renal disease: Secondary | ICD-10-CM | POA: Diagnosis not present

## 2021-05-11 DIAGNOSIS — E1122 Type 2 diabetes mellitus with diabetic chronic kidney disease: Secondary | ICD-10-CM | POA: Diagnosis not present

## 2021-05-11 DIAGNOSIS — Z4901 Encounter for fitting and adjustment of extracorporeal dialysis catheter: Secondary | ICD-10-CM | POA: Insufficient documentation

## 2021-05-11 DIAGNOSIS — I120 Hypertensive chronic kidney disease with stage 5 chronic kidney disease or end stage renal disease: Secondary | ICD-10-CM | POA: Diagnosis not present

## 2021-05-11 HISTORY — PX: DIALYSIS/PERMA CATHETER REMOVAL: CATH118289

## 2021-05-11 LAB — GLUCOSE, CAPILLARY
Glucose-Capillary: 53 mg/dL — ABNORMAL LOW (ref 70–99)
Glucose-Capillary: 67 mg/dL — ABNORMAL LOW (ref 70–99)
Glucose-Capillary: 88 mg/dL (ref 70–99)

## 2021-05-11 SURGERY — DIALYSIS/PERMA CATHETER REMOVAL
Anesthesia: LOCAL

## 2021-05-11 MED ORDER — LIDOCAINE-EPINEPHRINE (PF) 1 %-1:200000 IJ SOLN
INTRAMUSCULAR | Status: DC | PRN
  Administered 2021-05-11: 20 mL via INTRADERMAL

## 2021-05-11 SURGICAL SUPPLY — 3 items
CHLORAPREP W/TINT 26 (MISCELLANEOUS) ×4 IMPLANT
FORCEPS HALSTEAD CVD 5IN STRL (INSTRUMENTS) ×2 IMPLANT
TRAY LACERAT/PLASTIC (MISCELLANEOUS) ×2 IMPLANT

## 2021-05-11 NOTE — Op Note (Signed)
Operative Note  Preoperative diagnosis:    1. ESRD with functional permanent access  Postoperative diagnosis:   1. ESRD with functional permanent access  Procedure:  Removal of LEFT Permcath  Physician Assistant: Hezzie Bump PA-C  Surgeon:  Leotis Pain, MD  Anesthesia:  Local  EBL:  Minimal  Indication for the Procedure:  The patient has a functional permanent dialysis access and no longer needs their permcath.  This can be removed.  Risks and benefits are discussed and informed consent is obtained.  Description of the Procedure:  The patient's left neck, chest and existing catheter were sterilely prepped and draped. The area around the catheter was anesthetized copiously with 1% lidocaine. The catheter was dissected out with curved hemostats until the cuff was freed from the surrounding fibrous sheath. The fiber sheath was transected, and the catheter was then removed in its entirety using gentle traction. Pressure was held and sterile dressings were placed. The patient tolerated the procedure well and was taken to the recovery room in stable condition.  Discussed with Dr. Ellis Parents [facility] 05/11/2021, 1:08 PM  This note was created with Dragon Medical transcription system. Any errors in dictation are purely unintentional.

## 2021-05-11 NOTE — H&P (Signed)
[facility] Admission History & Physical  MRN : [id number]  Tiffany Holt is a 55 y.o. (1966/04/23) female who presents with chief complaint of scheduled PermCath removal.  History of Present Illness:  I am asked to evaluate the patient by the dialysis center. The patient was sent here because they have a functioning peritoneal dialysis catheter.  The patient reports they're not been any problems with any of their dialysis runs. They are reporting good flows with good parameters at dialysis. Patient denies pain or tenderness overlying the access.  There is no pain with dialysis.  The patient denies hand pain or finger pain consistent with steal syndrome.  No fevers or chills while on dialysis.  No current facility-administered medications for this encounter.   Past Medical History:  Diagnosis Date  . Anxiety   . Chronic kidney disease   . Coronary artery disease   . Diabetes mellitus without complication ([place])   . Hypertension   . Myocardial infarction ([place])   . Obesity   . Stroke [facility])    Past Surgical History:  Procedure Laterality Date  . AV FISTULA PLACEMENT    . CHOLECYSTECTOMY    . CORONARY ANGIOPLASTY    . CORONARY ARTERY BYPASS GRAFT  2010  . DIALYSIS/PERMA CATHETER INSERTION N/A 03/09/2021   Procedure: DIALYSIS/PERMA CATHETER INSERTION;  Surgeon: Algernon Huxley, MD;  Location: [facility];  Service: Cardiovascular;  Laterality: N/A;  . FISTULOGRAM    . KIDNEY SURGERY    . PERITONEAL CATHETER INSERTION     Social History Social History   Tobacco Use  . Smoking status: Never Smoker  . Smokeless tobacco: Never Used   Family History No family history on file.   No family history of bleeding or clotting disorders, autoimmune disease or porphyria  Allergies  Allergen Reactions  . No Known Allergies    REVIEW OF SYSTEMS (Negative unless checked)  Constitutional: '[]'$ Weight loss  '[]'$ Fever  '[]'$ Chills Cardiac: '[]'$ Chest pain   '[]'$ Chest  pressure   '[]'$ Palpitations   '[]'$ Shortness of breath when laying flat   '[]'$ Shortness of breath at rest   '[x]'$ Shortness of breath with exertion. Vascular:  '[]'$ Pain in legs with walking   '[]'$ Pain in legs at rest   '[]'$ Pain in legs when laying flat   '[]'$ Claudication   '[]'$ Pain in feet when walking  '[]'$ Pain in feet at rest  '[]'$ Pain in feet when laying flat   '[]'$ History of DVT   '[]'$ Phlebitis   '[]'$ Swelling in legs   '[]'$ Varicose veins   '[]'$ Non-healing ulcers Pulmonary:   '[]'$ Uses home oxygen   '[]'$ Productive cough   '[]'$ Hemoptysis   '[]'$ Wheeze  '[]'$ COPD   '[]'$ Asthma Neurologic:  '[]'$ Dizziness  '[]'$ Blackouts   '[]'$ Seizures   '[]'$ History of stroke   '[]'$ History of TIA  '[]'$ Aphasia   '[]'$ Temporary blindness   '[]'$ Dysphagia   '[]'$ Weakness or numbness in arms   '[]'$ Weakness or numbness in legs Musculoskeletal:  '[]'$ Arthritis   '[]'$ Joint swelling   '[]'$ Joint pain   '[]'$ Low back pain Hematologic:  '[]'$ Easy bruising  '[]'$ Easy bleeding   '[]'$ Hypercoagulable state   '[x]'$ Anemic  '[]'$ Hepatitis Gastrointestinal:  '[]'$ Blood in stool   '[]'$ Vomiting blood  '[]'$ Gastroesophageal reflux/heartburn   '[]'$ Difficulty swallowing. Genitourinary:  '[x]'$ Chronic kidney disease   '[]'$ Difficult urination  '[]'$ Frequent urination  '[]'$ Burning with urination   '[]'$ Blood in urine Skin:  '[]'$ Rashes   '[]'$ Ulcers   '[]'$ Wounds Psychological:  '[]'$ History of anxiety   '[]'$  History of major depression.  Physical Examination  Vitals:   05/11/21 1217 05/11/21 1259  BP: (!) 146/63 105/82  Pulse: 70 63  Resp: 18 18  Temp: 97.7 F (36.5 C)   TempSrc: Oral   SpO2: 92% 94%   There is no height or weight on file to calculate BMI. Gen: WD/WN, NAD Head: [place]/AT, No temporalis wasting. Prominent temp pulse not noted. Ear/Nose/Throat: Hearing grossly intact, nares w/o erythema or drainage, oropharynx w/o Erythema/Exudate,  Eyes: Conjunctiva clear, sclera non-icteric Neck: Trachea midline.  No JVD.  Pulmonary:  Good air movement, respirations not labored, no use of accessory muscles.  Cardiac: RRR, normal S1, S2. Vascular:  Vessel Right  Left  Radial Palpable Palpable  Ulnar Not Palpable Not Palpable  Brachial Palpable Palpable  Carotid Palpable, without bruit Palpable, without bruit   Left IJ PermCath: Intact. No signs of infection. Peritoneal dialysis catheter: Intact. No signs of infection.  Gastrointestinal: soft, non-tender/non-distended. No guarding/reflex.  Musculoskeletal: M/S 5/5 throughout.  Extremities without ischemic changes.  No deformity or atrophy.  Neurologic: Sensation grossly intact in extremities.  Symmetrical.  Speech is fluent. Motor exam as listed above. Psychiatric: Judgment intact, Mood & affect appropriate for pt's clinical situation. Dermatologic: No rashes or ulcers noted.  No cellulitis or open wounds. Lymph : No Cervical, Axillary, or Inguinal lymphadenopathy.  CBC No results found for: WBC, HGB, HCT, MCV, PLT  BMET No results found for: NA, K, CL, CO2, GLUCOSE, BUN, CREATININE, CALCIUM, GFRNONAA, GFRAA CrCl cannot be calculated (No successful lab value found.).  COAG No results found for: INR, PROTIME  Radiology No results found.  Assessment/Plan 1.  Complication dialysis device with thrombosis AV access:   The patient has a peritoneal dialysis catheter that is functioning well. Therefore, the patient will undergo removal of the tunneled catheter under local anesthesia.  The risks and benefits were described to the patient.  All questions were answered.  The patient agrees to proceed.   2.  End-stage renal disease requiring hemodialysis:  Patient will continue dialysis therapy without further interruption if a successful intervention is not achieved then a tunneled catheter will be placed. Dialysis has already been arranged. 3.  Hypertension:  Patient will continue medical management; nephrology is following no changes in oral medications.  Discussed with Dr. Mayme Genta, PA-C  05/11/2021 1:02 PM

## 2021-05-11 NOTE — Discharge Instructions (Signed)
Wound Care, Adult Taking care of your wound properly can help to prevent pain, infection, and scarring. It can also help your wound heal more quickly. Follow instructions from your health care provider about how to care for your wound. Supplies needed:  Soap and water.  Wound cleanser.  Gauze.  If needed, a clean bandage (dressing) or other type of wound dressing material to cover or place in the wound. Follow your health care provider's instructions about what dressing supplies to use.  Cream or ointment to apply to the wound, if told by your health care provider. How to care for your wound Cleaning the wound Ask your health care provider how to clean the wound. This may include:  Using mild soap and water or a wound cleanser.  Using a clean gauze to pat the wound dry after cleaning it. Do not rub or scrub the wound. Dressing care  Wash your hands with soap and water for at least 20 seconds before and after you change the dressing. If soap and water are not available, use hand sanitizer.  Change your dressing as told by your health care provider. This may include: ? Cleaning or rinsing out (irrigating) the wound. ? Placing a dressing over the wound or in the wound (packing). ? Covering the wound with an outer dressing.  Leave any stitches (sutures), skin glue, or adhesive strips in place. These skin closures may need to stay in place for 2 weeks or longer. If adhesive strip edges start to loosen and curl up, you may trim the loose edges. Do not remove adhesive strips completely unless your health care provider tells you to do that.  Ask your health care provider when you can leave the wound uncovered. Checking for infection Check your wound area every day for signs of infection. Check for:  More redness, swelling, or pain.  Fluid or blood.  Warmth.  Pus or a bad smell.   Follow these instructions at home Medicines  If you were prescribed an antibiotic medicine, cream, or  ointment, take or apply it as told by your health care provider. Do not stop using the antibiotic even if your condition improves.  If you were prescribed pain medicine, take it 30 minutes before you do any wound care or as told by your health care provider.  Take over-the-counter and prescription medicines only as told by your health care provider. Eating and drinking  Eat a diet that includes protein, vitamin A, vitamin C, and other nutrient-rich foods to help the wound heal. ? Foods rich in protein include meat, fish, eggs, dairy, beans, and nuts. ? Foods rich in vitamin A include carrots and dark green, leafy vegetables. ? Foods rich in vitamin C include citrus fruits, tomatoes, broccoli, and peppers.  Drink enough fluid to keep your urine pale yellow. General instructions  Do not take baths, swim, use a hot tub, or do anything that would put the wound underwater until your health care provider approves. Ask your health care provider if you may take showers. You may only be allowed to take sponge baths.  Do not scratch or pick at the wound. Keep it covered as told by your health care provider.  Return to your normal activities as told by your health care provider. Ask your health care provider what activities are safe for you.  Protect your wound from the sun when you are outside for the first 6 months, or for as long as told by your health care provider. Cover   up the scar area or apply sunscreen that has an SPF of at least 30.  Do not use any products that contain nicotine or tobacco, such as cigarettes, e-cigarettes, and chewing tobacco. These may delay wound healing. If you need help quitting, ask your health care provider.  Keep all follow-up visits as told by your health care provider. This is important. Contact a health care provider if:  You received a tetanus shot and you have swelling, severe pain, redness, or bleeding at the injection site.  Your pain is not controlled  with medicine.  You have any of these signs of infection: ? More redness, swelling, or pain around the wound. ? Fluid or blood coming from the wound. ? Warmth coming from the wound. ? Pus or a bad smell coming from the wound. ? A fever or chills.  You are nauseous or you vomit.  You are dizzy. Get help right away if:  You have a red streak of skin near the area around your wound.  Your wound has been closed with staples, sutures, skin glue, or adhesive strips and it begins to open up and separate.  Your wound is bleeding, and the bleeding does not stop with gentle pressure.  You have a rash.  You faint.  You have trouble breathing. These symptoms may represent a serious problem that is an emergency. Do not wait to see if the symptoms will go away. Get medical help right away. Call your local emergency services (911 in the [place] Do not drive yourself to the hospital. Summary  Always wash your hands with soap and water for at least 20 seconds before and after changing your dressing.  Change your dressing as told by your health care provider.  To help with healing, eat foods that are rich in protein, vitamin A, vitamin C, and other nutrients.  Check your wound every day for signs of infection. Contact your health care provider if you suspect that your wound is infected. This information is not intended to replace advice given to you by your health care provider. Make sure you discuss any questions you have with your health care provider. Document Revised: 09/28/2019 Document Reviewed: 09/28/2019 Elsevier Patient Education  2021 Elsevier Inc.  

## 2021-08-10 ENCOUNTER — Other Ambulatory Visit: Payer: Self-pay

## 2021-08-10 ENCOUNTER — Observation Stay
Admission: EM | Admit: 2021-08-10 | Discharge: 2021-08-11 | Disposition: A | Discharge status: Home / Self Care | Payer: Medicare Other | Attending: Family Medicine | Admitting: Family Medicine

## 2021-08-10 ENCOUNTER — Emergency Department: Payer: Medicare Other

## 2021-08-10 DIAGNOSIS — Z794 Long term (current) use of insulin: Secondary | ICD-10-CM

## 2021-08-10 DIAGNOSIS — Z79899 Other long term (current) drug therapy: Secondary | ICD-10-CM | POA: Diagnosis not present

## 2021-08-10 DIAGNOSIS — Z992 Dependence on renal dialysis: Secondary | ICD-10-CM | POA: Diagnosis not present

## 2021-08-10 DIAGNOSIS — I2510 Atherosclerotic heart disease of native coronary artery without angina pectoris: Secondary | ICD-10-CM | POA: Insufficient documentation

## 2021-08-10 DIAGNOSIS — Z9861 Coronary angioplasty status: Secondary | ICD-10-CM | POA: Diagnosis not present

## 2021-08-10 DIAGNOSIS — E1122 Type 2 diabetes mellitus with diabetic chronic kidney disease: Secondary | ICD-10-CM | POA: Diagnosis not present

## 2021-08-10 DIAGNOSIS — N186 End stage renal disease: Secondary | ICD-10-CM | POA: Insufficient documentation

## 2021-08-10 DIAGNOSIS — E785 Hyperlipidemia, unspecified: Secondary | ICD-10-CM

## 2021-08-10 DIAGNOSIS — J810 Acute pulmonary edema: Secondary | ICD-10-CM

## 2021-08-10 DIAGNOSIS — F32A Depression, unspecified: Secondary | ICD-10-CM

## 2021-08-10 DIAGNOSIS — Z7902 Long term (current) use of antithrombotics/antiplatelets: Secondary | ICD-10-CM | POA: Insufficient documentation

## 2021-08-10 DIAGNOSIS — R0602 Shortness of breath: Secondary | ICD-10-CM | POA: Diagnosis not present

## 2021-08-10 DIAGNOSIS — E119 Type 2 diabetes mellitus without complications: Secondary | ICD-10-CM

## 2021-08-10 DIAGNOSIS — Z20822 Contact with and (suspected) exposure to covid-19: Secondary | ICD-10-CM | POA: Insufficient documentation

## 2021-08-10 DIAGNOSIS — I5043 Acute on chronic combined systolic (congestive) and diastolic (congestive) heart failure: Principal | ICD-10-CM | POA: Insufficient documentation

## 2021-08-10 DIAGNOSIS — Z8673 Personal history of transient ischemic attack (TIA), and cerebral infarction without residual deficits: Secondary | ICD-10-CM | POA: Diagnosis not present

## 2021-08-10 DIAGNOSIS — E66813 Obesity, class 3: Secondary | ICD-10-CM

## 2021-08-10 DIAGNOSIS — Z951 Presence of aortocoronary bypass graft: Secondary | ICD-10-CM | POA: Insufficient documentation

## 2021-08-10 DIAGNOSIS — I132 Hypertensive heart and chronic kidney disease with heart failure and with stage 5 chronic kidney disease, or end stage renal disease: Secondary | ICD-10-CM | POA: Insufficient documentation

## 2021-08-10 DIAGNOSIS — I10 Essential (primary) hypertension: Secondary | ICD-10-CM | POA: Diagnosis present

## 2021-08-10 LAB — CBC
HCT: 42.7 % (ref 36.0–46.0)
Hemoglobin: 13.8 g/dL (ref 12.0–15.0)
MCH: 30.5 pg (ref 26.0–34.0)
MCHC: 32.3 g/dL (ref 30.0–36.0)
MCV: 94.5 fL (ref 80.0–100.0)
Platelets: 237 10*3/uL (ref 150–400)
RBC: 4.52 MIL/uL (ref 3.87–5.11)
RDW: 13.8 % (ref 11.5–15.5)
WBC: 12.3 10*3/uL — ABNORMAL HIGH (ref 4.0–10.5)
nRBC: 0 % (ref 0.0–0.2)

## 2021-08-10 LAB — RESP PANEL BY RT-PCR (FLU A&B, COVID) ARPGX2
Influenza A by PCR: NEGATIVE
Influenza B by PCR: NEGATIVE
SARS Coronavirus 2 by RT PCR: NEGATIVE

## 2021-08-10 LAB — BASIC METABOLIC PANEL
Anion gap: 9 (ref 5–15)
BUN: 49 mg/dL — ABNORMAL HIGH (ref 6–20)
CO2: 22 mmol/L (ref 22–32)
Calcium: 8.9 mg/dL (ref 8.9–10.3)
Chloride: 105 mmol/L (ref 98–111)
Creatinine, Ser: 4.86 mg/dL — ABNORMAL HIGH (ref 0.44–1.00)
GFR, Estimated: 10 mL/min — ABNORMAL LOW (ref >60)
Glucose, Bld: 136 mg/dL — ABNORMAL HIGH (ref 70–99)
Potassium: 4.3 mmol/L (ref 3.5–5.1)
Sodium: 136 mmol/L (ref 135–145)

## 2021-08-10 LAB — CBG MONITORING, ED: Glucose-Capillary: 95 mg/dL (ref 70–99)

## 2021-08-10 LAB — BRAIN NATRIURETIC PEPTIDE: B Natriuretic Peptide: 467.1 pg/mL — ABNORMAL HIGH (ref 0.0–100.0)

## 2021-08-10 LAB — PROCALCITONIN: Procalcitonin: <0.1 ng/mL

## 2021-08-10 LAB — TROPONIN I (HIGH SENSITIVITY)
Troponin I (High Sensitivity): 13 ng/L (ref <18)
Troponin I (High Sensitivity): 12 ng/L (ref <18)

## 2021-08-10 LAB — GLUCOSE, CAPILLARY: Glucose-Capillary: 140 mg/dL — ABNORMAL HIGH (ref 70–99)

## 2021-08-10 LAB — TSH: TSH: 7.393 u[IU]/mL — ABNORMAL HIGH (ref 0.350–4.500)

## 2021-08-10 MED ORDER — ACETAMINOPHEN 325 MG PO TABS: 650.0000 mg | ORAL_TABLET | ORAL | Status: DC | PRN

## 2021-08-10 MED ORDER — SODIUM CHLORIDE 0.9 % IV SOLN: 250.0000 mL | INTRAVENOUS | Status: DC | PRN

## 2021-08-10 MED ORDER — ALPRAZOLAM 0.25 MG PO TABS: 0.2500 mg | ORAL_TABLET | Freq: Two times a day (BID) | ORAL | Status: DC | PRN

## 2021-08-10 MED ORDER — INSULIN ASPART 100 UNIT/ML IJ SOLN: 0.0000 [IU] | Freq: Every day | INTRAMUSCULAR | Status: DC

## 2021-08-10 MED ORDER — ONDANSETRON HCL 4 MG/2ML IJ SOLN: 4.0000 mg | Freq: Four times a day (QID) | INTRAMUSCULAR | Status: DC | PRN

## 2021-08-10 MED ORDER — CLOPIDOGREL BISULFATE 75 MG PO TABS
75.0000 mg | ORAL_TABLET | Freq: Every day | ORAL | Status: DC
  Administered 2021-08-11: 75 mg via ORAL
  Filled 2021-08-10: qty 1

## 2021-08-10 MED ORDER — FLUOXETINE HCL 20 MG PO CAPS
20.0000 mg | ORAL_CAPSULE | Freq: Every day | ORAL | Status: DC
  Administered 2021-08-11: 20 mg via ORAL
  Filled 2021-08-10: qty 1

## 2021-08-10 MED ORDER — FUROSEMIDE 10 MG/ML IJ SOLN
80.0000 mg | Freq: Once | INTRAMUSCULAR | Status: AC
  Administered 2021-08-10: 80 mg via INTRAVENOUS
  Filled 2021-08-10: qty 8

## 2021-08-10 MED ORDER — IRBESARTAN 150 MG PO TABS
150.0000 mg | ORAL_TABLET | Freq: Every day | ORAL | Status: DC
  Administered 2021-08-10: 150 mg via ORAL
  Filled 2021-08-10 (×2): qty 1

## 2021-08-10 MED ORDER — INSULIN DETEMIR 100 UNIT/ML FLEXPEN: 65.0000 [IU] | PEN_INJECTOR | Freq: Every day | SUBCUTANEOUS | Status: DC

## 2021-08-10 MED ORDER — HYDRALAZINE HCL 10 MG PO TABS
10.0000 mg | ORAL_TABLET | Freq: Four times a day (QID) | ORAL | Status: DC | PRN
  Filled 2021-08-10: qty 1

## 2021-08-10 MED ORDER — ISOSORBIDE MONONITRATE ER 30 MG PO TB24
60.0000 mg | ORAL_TABLET | Freq: Every day | ORAL | Status: DC
  Administered 2021-08-11: 60 mg via ORAL
  Filled 2021-08-10: qty 2

## 2021-08-10 MED ORDER — BUPROPION HCL ER (XL) 150 MG PO TB24
300.0000 mg | ORAL_TABLET | Freq: Every day | ORAL | Status: DC
  Administered 2021-08-11: 300 mg via ORAL
  Filled 2021-08-10: qty 2

## 2021-08-10 MED ORDER — INSULIN DETEMIR 100 UNIT/ML ~~LOC~~ SOLN
65.0000 [IU] | Freq: Every day | SUBCUTANEOUS | Status: DC
  Administered 2021-08-10: 65 [IU] via SUBCUTANEOUS
  Filled 2021-08-10 (×2): qty 0.65

## 2021-08-10 MED ORDER — METOPROLOL SUCCINATE ER 50 MG PO TB24
100.0000 mg | ORAL_TABLET | Freq: Every day | ORAL | Status: DC
  Administered 2021-08-11: 100 mg via ORAL
  Filled 2021-08-10: qty 2

## 2021-08-10 MED ORDER — BUSPIRONE HCL 10 MG PO TABS
5.0000 mg | ORAL_TABLET | Freq: Every day | ORAL | Status: DC
  Administered 2021-08-11: 5 mg via ORAL
  Filled 2021-08-10: qty 1

## 2021-08-10 MED ORDER — SODIUM CHLORIDE 0.9% FLUSH
3.0000 mL | Freq: Two times a day (BID) | INTRAVENOUS | Status: DC
  Administered 2021-08-10 – 2021-08-11 (×2): 3 mL via INTRAVENOUS

## 2021-08-10 MED ORDER — HEPARIN SODIUM (PORCINE) 5000 UNIT/ML IJ SOLN
5000.0000 [IU] | Freq: Three times a day (TID) | INTRAMUSCULAR | Status: DC
  Administered 2021-08-10 – 2021-08-11 (×3): 5000 [IU] via SUBCUTANEOUS
  Filled 2021-08-10 (×3): qty 1

## 2021-08-10 MED ORDER — ATORVASTATIN CALCIUM 20 MG PO TABS
40.0000 mg | ORAL_TABLET | Freq: Every day | ORAL | Status: DC
  Administered 2021-08-11: 40 mg via ORAL
  Filled 2021-08-10: qty 2

## 2021-08-10 MED ORDER — SODIUM CHLORIDE 0.9% FLUSH: 3.0000 mL | INTRAVENOUS | Status: DC | PRN

## 2021-08-10 MED ORDER — EZETIMIBE 10 MG PO TABS
10.0000 mg | ORAL_TABLET | Freq: Every day | ORAL | Status: DC
  Administered 2021-08-11: 10 mg via ORAL
  Filled 2021-08-10: qty 1

## 2021-08-10 MED ORDER — INSULIN ASPART 100 UNIT/ML IJ SOLN: 0.0000 [IU] | Freq: Three times a day (TID) | INTRAMUSCULAR | Status: DC

## 2021-08-10 MED ORDER — FUROSEMIDE 10 MG/ML IJ SOLN
60.0000 mg | Freq: Two times a day (BID) | INTRAMUSCULAR | Status: DC
  Administered 2021-08-11: 60 mg via INTRAVENOUS
  Filled 2021-08-10 (×2): qty 8

## 2021-08-10 NOTE — ED Provider Notes (Signed)
[facility]  ____________________________________________   Event Date/Time   First MD Initiated Contact with Patient 08/10/21 1403     (approximate)  I have reviewed the triage vital signs and the nursing notes.   HISTORY  Chief Complaint Shortness of Breath and Chest Pain    HPI Tiffany Holt is a 55 y.o. female past medical history of end-stage renal disease on peritoneal dialysis, diabetes, hypertension, history of MI, stroke who presents with dyspnea on exertion.  Symptom onset was 3 days ago.  She notes that she feels short of breath while ambulating.  Has also had some constant chest pressure over the past 3 days.  It is not exertional.  She denies cough fevers or chills.  Has noticed some abdominal bloating for a while this is not new.  She does still make urine.  She takes torsemide 20 mg twice daily and has been compliant with this.  She denies any issues with her home dialysis.         Past Medical History:  Diagnosis Date   Anxiety    Chronic kidney disease    Coronary artery disease    Diabetes mellitus without complication ([place])    Hypertension    Myocardial infarction ([place])    Obesity    Stroke [facility])     Patient Active Problem List   Diagnosis Date Noted   Shortness of breath 08/10/2021   Essential hypertension 08/10/2021   Obesity, Class III, BMI 40-49.9 (morbid obesity) ([place]) 08/10/2021   Hyperlipidemia, acquired 08/10/2021   Depression 08/10/2021   ESRD (end stage renal disease) ([place]) 08/10/2021   Peritoneal dialysis status ([place]) 08/10/2021    Past Surgical History:  Procedure Laterality Date   AV FISTULA PLACEMENT     CHOLECYSTECTOMY     CORONARY ANGIOPLASTY     CORONARY ARTERY BYPASS GRAFT  2010   DIALYSIS/PERMA CATHETER INSERTION N/A 03/09/2021   Procedure: DIALYSIS/PERMA CATHETER INSERTION;  Surgeon: Algernon Huxley, MD;  Location: [facility];  Service: Cardiovascular;  Laterality: N/A;   DIALYSIS/PERMA CATHETER  REMOVAL N/A 05/11/2021   Procedure: DIALYSIS/PERMA CATHETER REMOVAL;  Surgeon: Algernon Huxley, MD;  Location: [facility];  Service: Cardiovascular;  Laterality: N/A;   FISTULOGRAM     KIDNEY SURGERY     PERITONEAL CATHETER INSERTION      Prior to Admission medications   Medication Sig Start Date End Date Taking? Authorizing Provider  atorvastatin (LIPITOR) 10 MG tablet Take 1 tablet by mouth daily. 05/08/20  Yes [provider]  buPROPion (WELLBUTRIN XL) 300 MG 24 hr tablet Take 1 tablet by mouth daily. 03/06/20  Yes [provider]  busPIRone (BUSPAR) 5 MG tablet Take by mouth. 04/08/20 11/13/21 Yes [provider]  clopidogrel (PLAVIX) 75 MG tablet  11/05/20  Yes [provider]  ezetimibe (ZETIA) 10 MG tablet Take 10 mg by mouth daily. 07/18/21  Yes [provider]  FLUoxetine (PROZAC) 20 MG capsule Take 20 mg by mouth daily. 07/18/21  Yes [provider]  irbesartan (AVAPRO) 150 MG tablet Take 150 mg by mouth at bedtime. 07/18/21  Yes [provider]  isosorbide mononitrate (IMDUR) 60 MG 24 hr tablet Take 60 mg by mouth daily. 07/18/21  Yes [provider]  LEVEMIR FLEXTOUCH 100 UNIT/ML FlexTouch Pen Inject 65 Units into the skin at bedtime. 05/09/21  Yes [provider]  metoprolol succinate (TOPROL-XL) 100 MG 24 hr tablet Take 100 mg by mouth daily. 07/18/21  Yes [provider]  NOVOLOG FLEXPEN 100 UNIT/ML FlexPen Inject 15 Units into the skin 3 (three) times daily before meals. 07/18/21  Yes [provider]  torsemide (DEMADEX) 20 MG tablet Take 40 mg by mouth daily. 07/18/21  Yes [provider]  ALPRAZolam (XANAX) 0.25 MG tablet alprazolam 0.25 mg tablet Patient not taking: No sig reported    [provider]  benzonatate (TESSALON) 100 MG capsule     [provider]  HYDROcodone-acetaminophen (NORCO) 7.5-325 MG tablet hydrocodone 7.5 mg-acetaminophen 325 mg  tablet Patient not taking: No sig reported    [provider]  ipratropium (ATROVENT) 0.06 % nasal spray Place 2 sprays into both nostrils 4 (four) times daily as needed for rhinitis. Patient not taking: Reported on 08/10/2021 01/10/21   Augusto Gamble B, NP  traZODone (DESYREL) 50 MG tablet Take 50 mg by mouth at bedtime. Patient not taking: Reported on 08/10/2021 03/04/21   [provider]    Allergies No known allergies and Wound dressing adhesive  No family history on file.  Social History Social History   Tobacco Use   Smoking status: Never   Smokeless tobacco: Never    Review of Systems   Review of Systems  Constitutional:  Negative for diaphoresis, fatigue and fever.  Respiratory:  Positive for chest tightness and shortness of breath. Negative for cough and choking.   Cardiovascular:  Positive for chest pain and leg swelling. Negative for palpitations.  Gastrointestinal:  Positive for abdominal distention. Negative for abdominal pain, nausea and vomiting.  Genitourinary:  Negative for difficulty urinating.  All other systems reviewed and are negative.  Physical Exam Updated Vital Signs BP (!) 152/75 (BP Location: Right Arm)   Pulse 70   Temp 97.9 F (36.6 C) (Oral)   Resp 20   Ht '5\' 6"'$  (1.676 m)   Wt 135.2 kg   SpO2 96%   BMI 48.10 kg/m   Physical Exam Vitals and nursing note reviewed.  Constitutional:      General: She is not in acute distress.    Appearance: Normal appearance.  HENT:     Head: Normocephalic and atraumatic.  Eyes:     General: No scleral icterus.    Conjunctiva/sclera: Conjunctivae normal.  Pulmonary:     Effort: Pulmonary effort is normal. No respiratory distress.     Breath sounds: No stridor. No decreased breath sounds.  Musculoskeletal:        General: No deformity or signs of injury.     Cervical back: Normal range of motion.     Right lower leg: Edema present.     Left lower leg: Edema present.     Comments: 1+  lower extremity edema bilaterally  Skin:    General: Skin is dry.     Coloration: Skin is not jaundiced or pale.  Neurological:     General: No focal deficit present.     Mental Status: She is alert and oriented to person, place, and time. Mental status is at baseline.  Psychiatric:        Mood and Affect: Mood normal.        Behavior: Behavior normal.     LABS (all labs ordered are listed, but only abnormal results are displayed)  Labs Reviewed  BASIC METABOLIC PANEL - Abnormal; Notable for the following components:      Result Value   Glucose, Bld 136 (*)    BUN 49 (*)    Creatinine, Ser 4.86 (*)  GFR, Estimated 10 (*)    All other components within normal limits  CBC - Abnormal; Notable for the following components:   WBC 12.3 (*)    All other components within normal limits  RESP PANEL BY RT-PCR (FLU A&B, COVID) ARPGX2  HIV ANTIBODY (ROUTINE TESTING W REFLEX)  TSH  PROCALCITONIN  BASIC METABOLIC PANEL  PROCALCITONIN  CBG MONITORING, ED  TROPONIN I (HIGH SENSITIVITY)  TROPONIN I (HIGH SENSITIVITY)   ____________________________________________  EKG  Low voltage, normal sinus rhythm, normal axis, no acute ischemic changes ____________________________________________  RADIOLOGY Almeta Monas, personally viewed and evaluated these images (plain radiographs) as part of my medical decision making, as well as reviewing the written report by the radiologist.  ED MD interpretation: I reviewed the chest x-ray which shows pulmonary edema    ____________________________________________   PROCEDURES  Procedure(s) performed (including Critical Care):  Procedures   ____________________________________________   INITIAL IMPRESSION / ASSESSMENT AND PLAN / ED COURSE     The patient is a 55 year old female on peritoneal dialysis who presents with dyspnea on exertion as well as chest pressure.  She is satting 95% on room air.  Overall appears comfortable.   She does have some lower extremity edema.  Her chest x-ray is consistent with pulmonary edema.  Labs are relatively reassuring, her creatinine is where it has been.  She does make urine and is compliant with her torsemide.  No other infectious symptoms.  Her EKG is nonischemic.  Initial troponin 13.  Attempted to walk the patient and she desaturated to 85%.  Given this abnormal ambulatory pulse ox will admit for IV diuresis.  We will give her a dose of 80 of Lasix now.      ____________________________________________   FINAL CLINICAL IMPRESSION(S) / ED DIAGNOSES  Final diagnoses:  Acute pulmonary edema [facility])     ED Discharge Orders     None        Note:  This document was prepared using Dragon voice recognition software and may include unintentional dictation errors.    Rada Hay, MD 08/10/21 [phone]

## 2021-08-10 NOTE — Progress Notes (Signed)
Pt refused CPAP. Pt aware that if she changes her mind one will be made available for her. RN aware.

## 2021-08-10 NOTE — ED Notes (Signed)
See triage note  Presents with some SOB and chest pressure /tightness  States sx's started 3 days ago  Denies any fever  resp even and non labored at rest  Becomes more SOB with exertion

## 2021-08-10 NOTE — ED Notes (Addendum)
Pt ambulated self efficiently with the use of a cane. Pt O2 sat @ rest 95%. During ambulation O2 sat decreased to 85%. Pt returned safely to bedside. Pt O2 sat @ 95%.

## 2021-08-10 NOTE — H&P (Signed)
History and Physical   Tiffany Holt [id number] DOB: 12-27-1966 DOA: 08/10/2021  PCP: Donnamarie Rossetti, PA-C  Outpatient Specialists: Dr. Murlean Iba, nephrology Patient coming from: Home  I have personally briefly reviewed patient's old medical records in [place].  Chief Concern: Shortness of breath  HPI: Tiffany Holt is a 55 y.o. female with medical history significant for end-stage renal disease on peritoneal dialysis, diabetes mellitus, hypertension, history of MI, history of stroke, morbid obesity, hyperlipidemia, depression/anxiety, presents emergency department for chief concerns of shortness of breath for 3 days.  She reports the shortness of breath is worse with exertion, and laying flatter in the bed. She reports that at baseline she sleeps on 3-4 pillows and has had t increased the incline in the last threes.   She started exercising by walking up the stairs about 1-1.5 week. She denies sick contacts, medication changes, diet changes, including PO fluid intake.   She drinks about 60 oz of soda, one cup of red solo cup of water.   At bedside, she is able to tell me her name, her current age, location of hospital and the current calendar year.   Social history: She lives at home with her son. She denies tobacco and recreational drug use. She endorses infrequent etoh use and last drink was last Friday, and it was one shot. 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   Vaccination history: She is vaccinated for covid 19, two doses of Moderna.   ROS: Constitutional: no weight change, no fever ENT/Mouth: no sore throat, no rhinorrhea Eyes: no eye pain, no vision changes Cardiovascular: no chest pain, no dyspnea,  no edema, no palpitations Respiratory: no cough, no sputum, no wheezing Gastrointestinal: no nausea, no vomiting, no diarrhea, no constipation Genitourinary: no urinary incontinence, no dysuria, no hematuria Musculoskeletal: no  arthralgias, no myalgias Skin: no skin lesions, no pruritus, Neuro: + weakness, no loss of consciousness, no syncope Psych: no anxiety, no depression, + decrease appetite Heme/Lymph: no bruising, no bleeding  ED Course: Discussed with emergency medicine provider, patient requiring hospitalization for chief concerns of shortness of breath and hypoxia.  Vitals in the emergency department was remarkable for temperature 97.9, respiration rate of 20, heart rate 65, blood pressure 175/82 and improved to 152/75, SPO2 of 95% on room air.  With movement and exertion, patient desatted to 85% on room air.  Labs in the emergency department was remarkable for WBC 12.3, hemoglobin 13.8, platelets 237, sodium 136, potassium 4.5, chloride 105, bicarb 22, BUN 49, serum creatinine of 4.86, nonfasting blood glucose 136, troponin was 13.  COVID PCR is pending.  ED provider ordered Lasix 80 mg IV.  Assessment/Plan  Principal Problem:   Shortness of breath Active Problems:   Essential hypertension   Obesity, Class III, BMI 40-49.9 (morbid obesity) ([place])   Hyperlipidemia, acquired   Depression   ESRD (end stage renal disease) ([place])   Peritoneal dialysis status (HCC)   Insulin dependent type 2 diabetes mellitus ([place])   # Shortness of breath in setting of no fluid restrictions - I counseled patient regarding soda intake and discontinuation of soda - I advised patient that she needs to drink approximately 30 to 40 ounces of fluid a day and that includes all fluids, including tea, soda, coffee, soup, water, juice - I suspect this is secondary to fluid overload - However at this time pneumonia and or thyroid involvement has not been excluded - Check procalcitonin, TSH, BNP, COVID PCR is pending -  Strict I's and O's - Status post Lasix 80 mg per EDP - Complete echo ordered  # End-stage renal disease on peritoneal dialysis-nephrology has been consulted - Patient's last dialysis session was evening of  08/09/2021  # History of MI and stroke-resumed home Plavix 75 mg daily  # Anxiety/depression-alprazolam 0.25 mg twice daily as needed for anxiety  # Morbid obesity-patient recently started a self exercise program including walking up the stairs - Extensive discussion with patient at bedside regarding initiation of exercise - I explained that if patient has been sedentary for long time would recommend that initiation of exercise include only walking on flat surfaces for about 10 minutes as tolerated, every other day - Patient can gradually increase to 15 minutes every other day and then 15 minutes every day as tolerated and gradually increasing to 20, 25 etc. minutes per day - Patient endorsed understanding and compliance  # Insulin-dependent diabetes mellitus-resumed home long-acting insulin 65 units nightly, insulin SSI with at bedtime coverage ordered  # History of hypertension-resumed home irbesartan 150 mg nightly, Imdur 60 mg daily, metoprolol succinate 100 mg daily  # Depression/anxiety-bupropion 300 mg daily, buspirone 5 mg daily, fluoxetine 20 mg daily - Xanax 0.25 mg p.o. twice daily as needed for anxiety, 1 day ordered  # Hyperlipidemia-atorvastatin 40 mg daily resumed  # Patient likely has OSA-CPAP nightly ordered  # COVID PCR is pending-continue airborne and contact precautions  Pending med reconciliation  Chart reviewed.   DVT prophylaxis: Heparin 5000 units every 8 hours Code Status: Full code Diet: Heart healthy/carb modified Family Communication: Updated son at bedside Disposition Plan: Pending clinical course Consults called: Nephrology Admission status: MedSurg, observation, telemetry  Past Medical History:  Diagnosis Date   Anxiety    Chronic kidney disease    Coronary artery disease    Diabetes mellitus without complication ([place])    Hypertension    Myocardial infarction ([place])    Obesity    Stroke [facility])    Past Surgical History:  Procedure Laterality  Date   AV FISTULA PLACEMENT     CHOLECYSTECTOMY     CORONARY ANGIOPLASTY     CORONARY ARTERY BYPASS GRAFT  2010   DIALYSIS/PERMA CATHETER INSERTION N/A 03/09/2021   Procedure: DIALYSIS/PERMA CATHETER INSERTION;  Surgeon: Algernon Huxley, MD;  Location: [facility];  Service: Cardiovascular;  Laterality: N/A;   DIALYSIS/PERMA CATHETER REMOVAL N/A 05/11/2021   Procedure: DIALYSIS/PERMA CATHETER REMOVAL;  Surgeon: Algernon Huxley, MD;  Location: [facility] [facility];  Service: Cardiovascular;  Laterality: N/A;   FISTULOGRAM     KIDNEY SURGERY     PERITONEAL CATHETER INSERTION     Social History:  reports that she has never smoked. She has never used smokeless tobacco. No history on file for alcohol use and drug use.  Allergies  Allergen Reactions   No Known Allergies    Wound Dressing Adhesive Dermatitis and Rash    Do not use paper tape   No family history on file. Family history: Family history reviewed and not pertinent  Prior to Admission medications   Medication Sig Start Date End Date Taking? Authorizing Provider  ALPRAZolam (XANAX) 0.25 MG tablet alprazolam 0.25 mg tablet Patient not taking: No sig reported    [provider]  amoxicillin-clavulanate (AUGMENTIN) 875-125 MG tablet Take 1 tablet by mouth every 12 (twelve) hours. Patient not taking: No sig reported 01/10/21   Augusto Gamble B, NP  atorvastatin (LIPITOR) 10 MG tablet Take 1 tablet by mouth  daily. 05/08/20   [provider]  atorvastatin (LIPITOR) 40 MG tablet atorvastatin 40 mg tablet  TAKE ONE TABLET BY MOUTH ONE TIME DAILY    [provider]  benzonatate (TESSALON) 100 MG capsule     [provider]  bumetanide (BUMEX) 2 MG tablet bumetanide 2 mg tablet Patient not taking: No sig reported    [provider]  buPROPion (WELLBUTRIN XL) 300 MG 24 hr tablet Take 1 tablet by mouth daily. 03/06/20   [provider]  busPIRone (BUSPAR) 5 MG tablet Take by mouth.  04/08/20 11/13/21  [provider]  clopidogrel (PLAVIX) 75 MG tablet  11/05/20   [provider]  HYDROcodone-acetaminophen (NORCO) 7.5-325 MG tablet hydrocodone 7.5 mg-acetaminophen 325 mg tablet Patient not taking: No sig reported    [provider]  ipratropium (ATROVENT) 0.06 % nasal spray Place 2 sprays into both nostrils 4 (four) times daily as needed for rhinitis. 01/10/21   Zigmund Gottron, NP   Physical Exam: Vitals:   08/10/21 1222 08/10/21 1613  BP: (!) 175/82 (!) 152/75  Pulse: 65 70  Resp: 20 20  Temp: 97.9 F (36.6 C)   TempSrc: Oral   SpO2: 95% 96%  Weight: 135.2 kg   Height: '5\' 6"'$  (1.676 m)    Constitutional: appears older than chronological age, chronically ill, NAD, calm, comfortable Eyes: PERRL, lids and conjunctivae normal ENMT: Mucous membranes are moist. Posterior pharynx clear of any exudate or lesions. Age-appropriate dentition. Hearing appropriate Neck: normal, supple, no masses, no thyromegaly Respiratory: clear to auscultation bilaterally, no wheezing, no crackles.  Mild respiratory effort.  Mild increased accessory muscle use.  Cardiovascular: Regular rate and rhythm, no murmurs / rubs / gallops. No extremity edema. 2+ pedal pulses. No carotid bruits. Left upper extremity AV fistula in place with bruit Abdomen: Morbidly obese abdomen with pannus, peritoneal dialysis catheter in place, no tenderness, no masses palpated, no hepatosplenomegaly. Bowel sounds positive.  Musculoskeletal: no clubbing / cyanosis. No joint deformity upper and lower extremities. Good ROM, no contractures, no atrophy. Normal muscle tone.  Skin: no rashes, lesions, ulcers. No induration.  Bilateral vein grafting scars of the lower extremity appears well-healed Neurologic: Sensation intact. Strength 5/5 in all 4.  Psychiatric: Normal judgment and insight. Alert and oriented x 3. Normal mood.   EKG: independently reviewed,showing sinus bradycardia with rate of  58, first-degree AV block, QTc 416  Chest x-ray on Admission: I personally reviewed and I agree with radiologist reading as below.  DG Chest 2 View  Result Date: 08/10/2021 CLINICAL DATA:  Chest pain. EXAM: CHEST - 2 VIEW COMPARISON:  None. FINDINGS: Mild cardiomegaly is noted with central pulmonary vascular congestion. Probable bilateral pulmonary edema is noted. Sternotomy wires are noted. No pneumothorax or pleural effusion is noted. Bony thorax is unremarkable. IMPRESSION: Mild cardiomegaly with central pulmonary vascular congestion and probable bilateral pulmonary edema. Aortic Atherosclerosis (ICD10-I70.0). Electronically Signed   By: Marijo Conception M.D.   On: 08/10/2021 13:59    Labs on Admission: I have personally reviewed following labs  CBC: Recent Labs  Lab 08/10/21 1250  WBC 12.3*  HGB 13.8  HCT 42.7  MCV 94.5  PLT [id number]   Basic Metabolic Panel: Recent Labs  Lab 08/10/21 1250  NA 136  K 4.3  CL 105  CO2 22  GLUCOSE 136*  BUN 49*  CREATININE 4.86*  CALCIUM 8.9   GFR: Estimated Creatinine Clearance: 18.3 mL/min (A) (by C-G formula based on SCr of 4.86  mg/dL (H)).  Dr. Tobie Poet [facility]  If 7PM-7AM, please contact overnight-coverage provider If 7AM-7PM, please contact day coverage provider [email or web address]  08/10/2021, 6:27 PM

## 2021-08-10 NOTE — ED Notes (Signed)
Pt moved to C-pod  Report given

## 2021-08-10 NOTE — ED Triage Notes (Signed)
Pt to ED for shob, chest tightness that started 3 days ago. Last dialysis last night.  Also reports dialysis.  Increased shob with exertion.  NAD noted. Speaking in complete sentences, RR even and unlabored.  States recently started exercising and was unsure if that was causing sx   Sig pad not working, verbalizes understanding of MSE waiver

## 2021-08-11 ENCOUNTER — Encounter: Payer: Self-pay | Admitting: Internal Medicine

## 2021-08-11 ENCOUNTER — Observation Stay
Admit: 2021-08-11 | Discharge: 2021-08-11 | Disposition: A | Discharge status: Home / Self Care | Payer: Medicare Other | Attending: Internal Medicine | Admitting: Internal Medicine

## 2021-08-11 DIAGNOSIS — R0602 Shortness of breath: Secondary | ICD-10-CM | POA: Diagnosis not present

## 2021-08-11 LAB — BASIC METABOLIC PANEL
Anion gap: 8 (ref 5–15)
BUN: 53 mg/dL — ABNORMAL HIGH (ref 6–20)
CO2: 23 mmol/L (ref 22–32)
Calcium: 8.6 mg/dL — ABNORMAL LOW (ref 8.9–10.3)
Chloride: 106 mmol/L (ref 98–111)
Creatinine, Ser: 4.82 mg/dL — ABNORMAL HIGH (ref 0.44–1.00)
GFR, Estimated: 10 mL/min — ABNORMAL LOW (ref >60)
Glucose, Bld: 117 mg/dL — ABNORMAL HIGH (ref 70–99)
Potassium: 4.1 mmol/L (ref 3.5–5.1)
Sodium: 137 mmol/L (ref 135–145)

## 2021-08-11 LAB — HEMOGLOBIN A1C
Hgb A1c MFr Bld: 8.1 % — ABNORMAL HIGH (ref 4.8–5.6)
Mean Plasma Glucose: 185.77 mg/dL

## 2021-08-11 LAB — GLUCOSE, CAPILLARY
Glucose-Capillary: 62 mg/dL — ABNORMAL LOW (ref 70–99)
Glucose-Capillary: 111 mg/dL — ABNORMAL HIGH (ref 70–99)

## 2021-08-11 LAB — PROCALCITONIN: Procalcitonin: <0.1 ng/mL

## 2021-08-11 LAB — HIV ANTIBODY (ROUTINE TESTING W REFLEX): HIV Screen 4th Generation wRfx: NONREACTIVE

## 2021-08-11 MED ORDER — LEVEMIR FLEXTOUCH 100 UNIT/ML ~~LOC~~ SOPN: 55.0000 [IU] | PEN_INJECTOR | Freq: Every evening | SUBCUTANEOUS | 11 refills | Status: AC

## 2021-08-11 MED ORDER — FUROSEMIDE 10 MG/ML IJ SOLN
60.0000 mg | Freq: Once | INTRAMUSCULAR | Status: AC
  Administered 2021-08-11: 60 mg via INTRAVENOUS
  Filled 2021-08-11: qty 8

## 2021-08-11 NOTE — Progress Notes (Signed)
*  PRELIMINARY RESULTS* Echocardiogram 2D Echocardiogram has been performed.  Tiffany Holt 08/11/2021, 2:26 PM

## 2021-08-11 NOTE — Progress Notes (Signed)
Inpatient Diabetes Program Recommendations  AACE/ADA: New Consensus Statement on Inpatient Glycemic Control   Target Ranges:  Prepandial:   less than 140 mg/dL      Peak postprandial:   less than 180 mg/dL (1-2 hours)      Critically ill patients:  140 - 180 mg/dL   Results for SHAIAN, KOCHIS (MRN [id number]) as of 08/11/2021 08:53  Ref. Range 08/10/2021 18:01 08/10/2021 21:15 08/11/2021 08:17  Glucose-Capillary Latest Ref Range: 70 - 99 mg/dL 95 140 (H) 62 (L)    Review of Glycemic Control  Diabetes history: DM2 Outpatient Diabetes medications: Levemir 65 units QHS, Novolog 15 units TID Current orders for Inpatient glycemic control: Levemir 65 units QHS, Novolog 0-20 units TID with meals, Novolog 0-5 units QHS  Inpatient Diabetes Program Recommendations:    Insulin: Please consider decreasing Levemir to 55 units QHS.  NOTE: In reviewing chart, noted patient sees H. Pasty Arch, NP and Dr. Honor Junes for DM management and was last seen 09/30/20 by Renae Gloss, NP. Per office note on 09/30/20 patient was prescribed Levemir 70 units daily, Novolog 10 units TID with meals plus correction scale 2-12 units based on glucose. Patient admitted with shortness of breath and has hx of ESRD with peritoneal dialysis. Fasting glucose 62 mg/dl today. Recommend to decrease Levemir to 55 units QHS.  Thanks, Barnie Alderman, RN, MSN, CDE Diabetes Coordinator Inpatient Diabetes Program [phone] (Team Pager from 8am to 5pm)   Thanks, Barnie Alderman, RN, MSN, CDE Diabetes Coordinator Inpatient Diabetes Program [phone] (Team Pager from 8am to 5pm)

## 2021-08-11 NOTE — Discharge Summary (Signed)
Physician Discharge Summary  Tola Rivest [id number] DOB: 1966-12-13 DOA: 08/10/2021  PCP: Donnamarie Rossetti, PA-C  Admit date: 08/10/2021 Discharge date: 08/11/2021  Admitted From: Home  Disposition:  Home   Recommendations for Outpatient Follow-up:  Follow up with PCP in 1-2 weeks Follow up with Nephrology as directed Grayland Ormond: Please follow up echocardiogram obtained during hospitalization to compare EF to prior EF 45%      Home Health: None   Equipment/Devices: None new  Discharge Condition: Good  CODE STATUS: FULL Diet recommendation: Renal, diabetic  Brief/Interim Summary: Mrs. Larose is a 55 y.o. F with ESRD on PD, DM, HTN, obesity, hx stroke and MI, and depression/anxiety who presented for few days progressive SOB and orthopnea.  In the ER, CXR showed CM and congestion and she desaturated to 85% on room air.        PRINCIPAL HOSPITAL DIAGNOSIS: Acute on chronic systolic and diastolic CHF    Discharge Diagnoses:   Acute on chronic systolic and diastolic CHF EF last March was 45%.  Patient presented with cardiomegaly, orthopnea, dyspnea on exertion, and edema on chest x-ray.  She was diuresed with IV Lasix, and her symptoms resolved.  She was able to ambulate without hypoxia or desaturation.  She will continue peritoneal dialysis. She was counseled on fluid restriction.     Coronary artery disease, secondary prevention End-stage renal disease on peritoneal dialysis History of cerebrovascular disease on Plavix Anxiety/depression Morbid obesity, BMI 49 Insulin-dependent diabetes, insulin-dependent, with end-stage renal disease Hypertension         Discharge Instructions  Discharge Instructions     Diet - low sodium heart healthy   Complete by: As directed    Discharge instructions   Complete by: As directed    From Dr. Loleta Books: You were admitted for trouble breathing with walking and with laying flat We agree, this seems to have  been from fluid overload This was likely from just drinking more fluid than you were taking off with dialysis.  As we discussed, regulate the amount of fluid you drink during the day and at night Go see Dr. Candiss Norse in 1 week or as soon as he is able to see you  Weigh yourself every day and record this and show it to him  Resume your normal home medicines  One exception is that we noticed your blood sugar here was low with your home insulin, so reduce your Levemir and go see your primary care doctor   Increase activity slowly   Complete by: As directed       Allergies as of 08/11/2021       Reactions   No Known Allergies    Wound Dressing Adhesive Dermatitis, Rash   Do not use paper tape        Medication List     STOP taking these medications    ALPRAZolam 0.25 MG tablet Commonly known as: XANAX   benzonatate 100 MG capsule Commonly known as: TESSALON   HYDROcodone-acetaminophen 7.5-325 MG tablet Commonly known as: NORCO   ipratropium 0.06 % nasal spray Commonly known as: ATROVENT   traZODone 50 MG tablet Commonly known as: DESYREL       TAKE these medications    atorvastatin 10 MG tablet Commonly known as: LIPITOR Take 1 tablet by mouth daily. Notes to patient: Last dose given 08/11/2021 at 08:29am   buPROPion 300 MG 24 hr tablet Commonly known as: WELLBUTRIN XL Take 1 tablet by mouth daily. Notes to patient: Last dose  given 08/11/2021 at 08:28am   busPIRone 5 MG tablet Commonly known as: BUSPAR Take by mouth. Notes to patient: Last dose given 08/11/2021 at 12:49pm   clopidogrel 75 MG tablet Commonly known as: PLAVIX Notes to patient: Last dose given 08/11/2021 at 08:28am   ezetimibe 10 MG tablet Commonly known as: ZETIA Take 10 mg by mouth daily. Notes to patient: Last dose given 08/11/2021 at 08:28am   FLUoxetine 20 MG capsule Commonly known as: PROZAC Take 20 mg by mouth daily. Notes to patient: Last dose given 08/11/2021 at 08:28am    irbesartan 150 MG tablet Commonly known as: AVAPRO Take 150 mg by mouth at bedtime. Notes to patient: Last dose given 08/10/2021 at 10:48pm   isosorbide mononitrate 60 MG 24 hr tablet Commonly known as: IMDUR Take 60 mg by mouth daily. Notes to patient: Last dose given 08/11/2021 at 08:39am   Levemir FlexTouch 100 UNIT/ML FlexPen Generic drug: insulin detemir Inject 55 Units into the skin at bedtime. What changed: how much to take Notes to patient: Last dose given 08/10/2021 at 7:32pm   metoprolol succinate 100 MG 24 hr tablet Commonly known as: TOPROL-XL Take 100 mg by mouth daily. Notes to patient: Last dose given 08/11/2021 at 08:29am   NovoLOG FlexPen 100 UNIT/ML FlexPen Generic drug: insulin aspart Inject 15 Units into the skin 3 (three) times daily before meals. Notes to patient: Not given while in hospital.    torsemide 20 MG tablet Commonly known as: DEMADEX Take 40 mg by mouth daily. Notes to patient: Not given while in hospital.         Follow-up Information     [place], [employer], PA-C. Schedule an appointment as soon as possible for a visit in 1 week(s).   Specialty: Family Medicine Contact information: [place] [address] [phone]         Murlean Iba, MD. Schedule an appointment as soon as possible for a visit in 1 week(s).   Specialty: Nephrology Contact information: [place] [address] [phone]                Allergies  Allergen Reactions   No Known Allergies    Wound Dressing Adhesive Dermatitis and Rash    Do not use paper tape    Consultations: Nephrology   Procedures/Studies: DG Chest 2 View  Result Date: 08/10/2021 CLINICAL DATA:  Chest pain. EXAM: CHEST - 2 VIEW COMPARISON:  None. FINDINGS: Mild cardiomegaly is noted with central pulmonary vascular congestion. Probable bilateral pulmonary edema is noted. Sternotomy wires are noted. No pneumothorax  or pleural effusion is noted. Bony thorax is unremarkable. IMPRESSION: Mild cardiomegaly with central pulmonary vascular congestion and probable bilateral pulmonary edema. Aortic Atherosclerosis (ICD10-I70.0). Electronically Signed   By: Marijo Conception M.D.   On: 08/10/2021 13:59      Subjective: Feeling well.  No orthopnea.  No dyspnea on exertion.  No chest pain.  No leg swelling.  Discharge Exam: Vitals:   08/11/21 0802 08/11/21 1157  BP: 135/75 130/67  Pulse: (!) 55 (!) 54  Resp: 17 17  Temp: (!) 97.5 F (36.4 C) 98 F (36.7 C)  SpO2: 98% 90%   Vitals:   08/10/21 2106 08/11/21 0542 08/11/21 0802 08/11/21 1157  BP:  (!) 143/56 135/75 130/67  Pulse:  (!) 56 (!) 55 (!) 54  Resp:  '19 17 17  '$ Temp:  97.9 F (36.6 C) (!) 97.5 F (36.4 C) 98 F (36.7 C)  TempSrc:    Oral  SpO2:  91% 98% 90%  Weight: (!) 139.8 kg     Height:        General: Pt is alert, awake, not in acute distress Cardiovascular: RRR, nl S1-S2, no murmurs appreciated.   No LE edema.   Respiratory: Normal respiratory rate and rhythm.  CTAB without rales or wheezes. Abdominal: Abdomen soft and non-tender.  No distension or HSM.   Neuro/Psych: Strength symmetric in upper and lower extremities.  Judgment and insight appear normal.   The results of significant diagnostics from this hospitalization (including imaging, microbiology, ancillary and laboratory) are listed below for reference.     Microbiology: Recent Results (from the past 240 hour(s))  Resp Panel by RT-PCR (Flu A&B, Covid) Nasopharyngeal Swab     Status: None   Collection Time: 08/10/21  4:33 PM   Specimen: Nasopharyngeal Swab; Nasopharyngeal(NP) swabs in vial transport medium  Result Value Ref Range Status   SARS Coronavirus 2 by RT PCR NEGATIVE NEGATIVE Final    Comment: (NOTE) SARS-CoV-2 target nucleic acids are NOT DETECTED.  The SARS-CoV-2 RNA is generally detectable in upper respiratory specimens during the acute phase of infection.  The lowest concentration of SARS-CoV-2 viral copies this assay can detect is 138 copies/mL. A negative result does not preclude SARS-Cov-2 infection and should not be used as the sole basis for treatment or other patient management decisions. A negative result may occur with  improper specimen collection/handling, submission of specimen other than nasopharyngeal swab, presence of viral mutation(s) within the areas targeted by this assay, and inadequate number of viral copies(<138 copies/mL). A negative result must be combined with clinical observations, patient history, and epidemiological information. The expected result is Negative.  Fact Sheet for Patients:  EntrepreneurPulse.com.au  Fact Sheet for Healthcare Providers:  IncredibleEmployment.be  This test is no t yet approved or cleared by the [place] FDA and  has been authorized for detection and/or diagnosis of SARS-CoV-2 by FDA under an Emergency Use Authorization (EUA). This EUA will remain  in effect (meaning this test can be used) for the duration of the COVID-19 declaration under Section 564(b)(1) of the Act, 21 U.S.C.section 360bbb-3(b)(1), unless the authorization is terminated  or revoked sooner.       Influenza A by PCR NEGATIVE NEGATIVE Final   Influenza B by PCR NEGATIVE NEGATIVE Final    Comment: (NOTE) The Xpert Xpress SARS-CoV-2/FLU/RSV plus assay is intended as an aid in the diagnosis of influenza from Nasopharyngeal swab specimens and should not be used as a sole basis for treatment. Nasal washings and aspirates are unacceptable for Xpert Xpress SARS-CoV-2/FLU/RSV testing.  Fact Sheet for Patients: EntrepreneurPulse.com.au  Fact Sheet for Healthcare Providers: IncredibleEmployment.be  This test is not yet approved or cleared by the [place] FDA and has been authorized for detection and/or diagnosis of SARS-CoV-2 by FDA  under an Emergency Use Authorization (EUA). This EUA will remain in effect (meaning this test can be used) for the duration of the COVID-19 declaration under Section 564(b)(1) of the Act, 21 U.S.C. section 360bbb-3(b)(1), unless the authorization is terminated or revoked.  Performed at [facility], [address] [place], [place] [address]      Labs: BNP (last 3 results) Recent Labs    08/10/21 1250  BNP [id number]*   Basic Metabolic Panel: Recent Labs  Lab 08/10/21 1250 08/11/21 0258  NA 136 137  K 4.3 4.1  CL 105 106  CO2 22 23  GLUCOSE 136* 117*  BUN 49* 53*  CREATININE 4.86* 4.82*  CALCIUM 8.9 8.6*   Liver Function Tests: No results for input(s): AST, ALT, ALKPHOS, BILITOT, PROT, ALBUMIN in the last 168 hours. No results for input(s): LIPASE, AMYLASE in the last 168 hours. No results for input(s): AMMONIA in the last 168 hours. CBC: Recent Labs  Lab 08/10/21 1250  WBC 12.3*  HGB 13.8  HCT 42.7  MCV 94.5  PLT 237   Cardiac Enzymes: No results for input(s): CKTOTAL, CKMB, CKMBINDEX, TROPONINI in the last 168 hours. BNP: Invalid input(s): POCBNP CBG: Recent Labs  Lab 08/10/21 1801 08/10/21 2115 08/11/21 0817 08/11/21 1155  GLUCAP 95 140* 62* 111*   D-Dimer No results for input(s): DDIMER in the last 72 hours. Hgb A1c Recent Labs    08/11/21 0258  HGBA1C 8.1*   Lipid Profile No results for input(s): CHOL, HDL, LDLCALC, TRIG, CHOLHDL, LDLDIRECT in the last 72 hours. Thyroid function studies Recent Labs    08/10/21 1250  TSH 7.393*   Anemia work up No results for input(s): VITAMINB12, FOLATE, FERRITIN, TIBC, IRON, RETICCTPCT in the last 72 hours. Urinalysis No results found for: COLORURINE, APPEARANCEUR, [place], [place], GLUCOSEU, [place], [place], [place], PROTEINUR, UROBILINOGEN, NITRITE, LEUKOCYTESUR Sepsis Labs Invalid input(s): PROCALCITONIN,  WBC,  LACTICIDVEN Microbiology Recent Results (from the past 240 hour(s))   Resp Panel by RT-PCR (Flu A&B, Covid) Nasopharyngeal Swab     Status: None   Collection Time: 08/10/21  4:33 PM   Specimen: Nasopharyngeal Swab; Nasopharyngeal(NP) swabs in vial transport medium  Result Value Ref Range Status   SARS Coronavirus 2 by RT PCR NEGATIVE NEGATIVE Final    Comment: (NOTE) SARS-CoV-2 target nucleic acids are NOT DETECTED.  The SARS-CoV-2 RNA is generally detectable in upper respiratory specimens during the acute phase of infection. The lowest concentration of SARS-CoV-2 viral copies this assay can detect is 138 copies/mL. A negative result does not preclude SARS-Cov-2 infection and should not be used as the sole basis for treatment or other patient management decisions. A negative result may occur with  improper specimen collection/handling, submission of specimen other than nasopharyngeal swab, presence of viral mutation(s) within the areas targeted by this assay, and inadequate number of viral copies(<138 copies/mL). A negative result must be combined with clinical observations, patient history, and epidemiological information. The expected result is Negative.  Fact Sheet for Patients:  EntrepreneurPulse.com.au  Fact Sheet for Healthcare Providers:  IncredibleEmployment.be  This test is no t yet approved or cleared by the [place] FDA and  has been authorized for detection and/or diagnosis of SARS-CoV-2 by FDA under an Emergency Use Authorization (EUA). This EUA will remain  in effect (meaning this test can be used) for the duration of the COVID-19 declaration under Section 564(b)(1) of the Act, 21 U.S.C.section 360bbb-3(b)(1), unless the authorization is terminated  or revoked sooner.       Influenza A by PCR NEGATIVE NEGATIVE Final   Influenza B by PCR NEGATIVE NEGATIVE Final    Comment: (NOTE) The Xpert Xpress SARS-CoV-2/FLU/RSV plus assay is intended as an aid in the diagnosis of influenza from  Nasopharyngeal swab specimens and should not be used as a sole basis for treatment. Nasal washings and aspirates are unacceptable for Xpert Xpress SARS-CoV-2/FLU/RSV testing.  Fact Sheet for Patients: EntrepreneurPulse.com.au  Fact Sheet for Healthcare Providers: IncredibleEmployment.be  This test is not yet approved or cleared by the [place] FDA and has been authorized for detection and/or diagnosis of SARS-CoV-2 by FDA under an Emergency Use Authorization (  EUA). This EUA will remain in effect (meaning this test can be used) for the duration of the COVID-19 declaration under Section 564(b)(1) of the Act, 21 U.S.C. section 360bbb-3(b)(1), unless the authorization is terminated or revoked.  Performed at [facility], [address] [place], [place] [address]      Time coordinating discharge: 25 minutes         SIGNED:   Edwin Dada, MD  [facility] 08/11/2021, 3:43 PM

## 2021-08-11 NOTE — Progress Notes (Signed)
Peritoneal dialysis patient known at [facility]. Education provided, patient stated no dialysis concerns. Please contact me with any dialysis placement concerns.  Elvera Bicker Dialysis Coordinator [phone]

## 2021-08-11 NOTE — TOC Transition Note (Signed)
Transition of Care [facility]) - CM/SW Discharge Note   Patient Details  Name: Tiffany Holt MRN: [id number] Date of Birth: 11/25/1966  Transition of Care [facility]) CM/SW Contact:  Candie Chroman, LCSW Phone Number: 08/11/2021, 4:01 PM   Clinical Narrative: Patient has orders to discharge home today. This CSW working remote today. Called into the room, introduced role, and explained that discharge planning would be discussed. PCP is Grayland Ormond PA-C at [facility]. She has family members or friends drive her to appointments. Pharmacy is [facility] in [place]. No issues obtaining medications. No home health prior to admission. She uses a single-point cane when she goes out in the community but no DME use at home. Patient has a scale at home weighs daily to monitor fluid build up. She says she will have a ride home today. No further concerns. CSW signing off.    Final next level of care: Home/Self Care Barriers to Discharge: No Barriers Identified   Patient Goals and CMS Choice        Discharge Placement                    Patient and family notified of of transfer: 08/11/21  Discharge Plan and Services                                     Social Determinants of Health (SDOH) Interventions     Readmission Risk Interventions No flowsheet data found.

## 2021-08-11 NOTE — Progress Notes (Signed)
[facility]  ROUNDING NOTE   Subjective:   Tiffany Holt is a 55 y.o. female with past medical history including diabetes, hypertension, MI, stroke, HLD, and ESRD on peritoneal dialysis. She presents to the ED with complaints of shortness of breath for the past 3 days. She has been admitted under observation for Shortness of breath [R06.02] Acute pulmonary edema ([place]) [J81.0]  Patient is known to our clinic and receives PD care through [facility], supervised by Dr Candiss Norse. She reports no further chest pain since admission. Currently on room air with no complaints. Tolerating meals, denies nausea and vomiting. States she drinks roughly 60oz of soda and a 16-20oz cup of water a day. Has maintained PD schedule at home. Currently receiving Lasix with successful diuresis.    Objective:  Vital signs in last 24 hours:  Temp:  [97.5 F (36.4 C)-97.9 F (36.6 C)] 97.5 F (36.4 C) (08/16 0802) Pulse Rate:  [55-70] 55 (08/16 0802) Resp:  [15-20] 17 (08/16 0802) BP: (115-175)/(56-97) 135/75 (08/16 0802) SpO2:  [91 %-98 %] 98 % (08/16 0802) Weight:  [135.2 kg-139.8 kg] 139.8 kg (08/15 2106)  Weight change:  Filed Weights   08/10/21 1222 08/10/21 2106  Weight: 135.2 kg (!) 139.8 kg    Intake/Output: No intake/output data recorded.   Intake/Output this shift:  No intake/output data recorded.  Physical Exam: General: NAD,   Head: Normocephalic, atraumatic. Moist oral mucosal membranes  Eyes: Anicteric  Lungs:  Clear to auscultation, normal effort  Heart: Regular rate and rhythm  Abdomen:  Soft, nontender  Extremities:  1+ peripheral edema.  Neurologic: Nonfocal, moving all four extremities  Skin: No lesions  Access: PD catheter    Basic Metabolic Panel: Recent Labs  Lab 08/10/21 1250 08/11/21 0258  NA 136 137  K 4.3 4.1  CL 105 106  CO2 22 23  GLUCOSE 136* 117*  BUN 49* 53*  CREATININE 4.86* 4.82*  CALCIUM 8.9 8.6*    Liver Function Tests: No results for  input(s): AST, ALT, ALKPHOS, BILITOT, PROT, ALBUMIN in the last 168 hours. No results for input(s): LIPASE, AMYLASE in the last 168 hours. No results for input(s): AMMONIA in the last 168 hours.  CBC: Recent Labs  Lab 08/10/21 1250  WBC 12.3*  HGB 13.8  HCT 42.7  MCV 94.5  PLT 237    Cardiac Enzymes: No results for input(s): CKTOTAL, CKMB, CKMBINDEX, TROPONINI in the last 168 hours.  BNP: Invalid input(s): POCBNP  CBG: Recent Labs  Lab 08/10/21 1801 08/10/21 2115 08/11/21 0817  GLUCAP 95 140* 62*    Microbiology: Results for orders placed or performed during the hospital encounter of 08/10/21  Resp Panel by RT-PCR (Flu A&B, Covid) Nasopharyngeal Swab     Status: None   Collection Time: 08/10/21  4:33 PM   Specimen: Nasopharyngeal Swab; Nasopharyngeal(NP) swabs in vial transport medium  Result Value Ref Range Status   SARS Coronavirus 2 by RT PCR NEGATIVE NEGATIVE Final    Comment: (NOTE) SARS-CoV-2 target nucleic acids are NOT DETECTED.  The SARS-CoV-2 RNA is generally detectable in upper respiratory specimens during the acute phase of infection. The lowest concentration of SARS-CoV-2 viral copies this assay can detect is 138 copies/mL. A negative result does not preclude SARS-Cov-2 infection and should not be used as the sole basis for treatment or other patient management decisions. A negative result may occur with  improper specimen collection/handling, submission of specimen other than nasopharyngeal swab, presence of viral mutation(s) within the areas targeted  by this assay, and inadequate number of viral copies(<138 copies/mL). A negative result must be combined with clinical observations, patient history, and epidemiological information. The expected result is Negative.  Fact Sheet for Patients:  EntrepreneurPulse.com.au  Fact Sheet for Healthcare Providers:  IncredibleEmployment.be  This test is no t yet approved or  cleared by the [place] FDA and  has been authorized for detection and/or diagnosis of SARS-CoV-2 by FDA under an Emergency Use Authorization (EUA). This EUA will remain  in effect (meaning this test can be used) for the duration of the COVID-19 declaration under Section 564(b)(1) of the Act, 21 U.S.C.section 360bbb-3(b)(1), unless the authorization is terminated  or revoked sooner.       Influenza A by PCR NEGATIVE NEGATIVE Final   Influenza B by PCR NEGATIVE NEGATIVE Final    Comment: (NOTE) The Xpert Xpress SARS-CoV-2/FLU/RSV plus assay is intended as an aid in the diagnosis of influenza from Nasopharyngeal swab specimens and should not be used as a sole basis for treatment. Nasal washings and aspirates are unacceptable for Xpert Xpress SARS-CoV-2/FLU/RSV testing.  Fact Sheet for Patients: EntrepreneurPulse.com.au  Fact Sheet for Healthcare Providers: IncredibleEmployment.be  This test is not yet approved or cleared by the [place] FDA and has been authorized for detection and/or diagnosis of SARS-CoV-2 by FDA under an Emergency Use Authorization (EUA). This EUA will remain in effect (meaning this test can be used) for the duration of the COVID-19 declaration under Section 564(b)(1) of the Act, 21 U.S.C. section 360bbb-3(b)(1), unless the authorization is terminated or revoked.  Performed at [facility], [address] [place], [place] [address]     Coagulation Studies: No results for input(s): LABPROT, INR in the last 72 hours.  Urinalysis: No results for input(s): COLORURINE, LABSPEC, PHURINE, GLUCOSEU, HGBUR, BILIRUBINUR, KETONESUR, PROTEINUR, UROBILINOGEN, NITRITE, LEUKOCYTESUR in the last 72 hours.  Invalid input(s): APPERANCEUR    Imaging: DG Chest 2 View  Result Date: 08/10/2021 CLINICAL DATA:  Chest pain. EXAM: CHEST - 2 VIEW COMPARISON:  None. FINDINGS: Mild cardiomegaly is noted with central  pulmonary vascular congestion. Probable bilateral pulmonary edema is noted. Sternotomy wires are noted. No pneumothorax or pleural effusion is noted. Bony thorax is unremarkable. IMPRESSION: Mild cardiomegaly with central pulmonary vascular congestion and probable bilateral pulmonary edema. Aortic Atherosclerosis (ICD10-I70.0). Electronically Signed   By: Marijo Conception M.D.   On: 08/10/2021 13:59     Medications:    sodium chloride      atorvastatin  40 mg Oral Daily   buPROPion  300 mg Oral Daily   busPIRone  5 mg Oral Q1200   clopidogrel  75 mg Oral Daily   ezetimibe  10 mg Oral Daily   FLUoxetine  20 mg Oral Daily   furosemide  60 mg Intravenous BID   heparin  5,000 Units Subcutaneous Q8H   insulin aspart  0-20 Units Subcutaneous TID WC   insulin aspart  0-5 Units Subcutaneous QHS   insulin detemir  65 Units Subcutaneous QHS   irbesartan  150 mg Oral QHS   isosorbide mononitrate  60 mg Oral Daily   metoprolol succinate  100 mg Oral Daily   sodium chloride flush  3 mL Intravenous Q12H   sodium chloride, acetaminophen, ALPRAZolam, hydrALAZINE, ondansetron (ZOFRAN) IV, sodium chloride flush  Assessment/ Plan:  Ms. Tiffany Holt is a 55 y.o.  female with past medical history including diabetes, hypertension, MI, stroke, HLD, and ESRD on peritoneal dialysis. She presents to the ED with complaints  of shortness of breath for the past 3 days. She has been admitted under observation for Shortness of breath [R06.02] Acute pulmonary edema ([facility]) [J81.0]  Fluid overload with shortness of breath Likely due to excessive fluid intake IV Lasix '80mg'$  given in ED IV Lasix '60mg'$  given in today.  UOP 631m today with decreased symptoms Discussed fluid restriction with patient.   2. End stage renal disease on peritoneal dialysis Has maintained dialysis nightly.  Last treatment was night prior to admission If remains inpatient, will provide PD tonight with 2.5% dialysate.  Educated patient on  restricting fluids, not as strict has hemodialysis, but not as freely as she has been drinking. 1.5L restriction recommended.   3. Anemia of chronic kidney disease Lab Results  Component Value Date   HGB 13.8 08/10/2021   Hgb at goal  4. Secondary Hyperparathyroidism:  Lab Results  Component Value Date   CALCIUM 8.6 (L) 08/11/2021   Calcium not at goal Will continue to monitor for need of supplements  5 Diabetes mellitus type II with chronic kidney disease  insulin dependent. Home regimen includes Novolog. Most recent hemoglobin A1c is 8.1 on 08/11/21. Stable    LOS: 0 Glenice Ciccone 8/16/20228:58 AM

## 2021-08-12 LAB — ECHOCARDIOGRAM COMPLETE
Height: 66 in
S' Lateral: 3.9 cm
Weight: 4931.25 oz

## 2021-12-17 IMAGING — CR DG CHEST 2V
2 series · 2 of 2 positions shown · non-contrast
Comparison: None.

CLINICAL DATA: Chest pain.

EXAM:
CHEST - 2 VIEW

[chest pa]
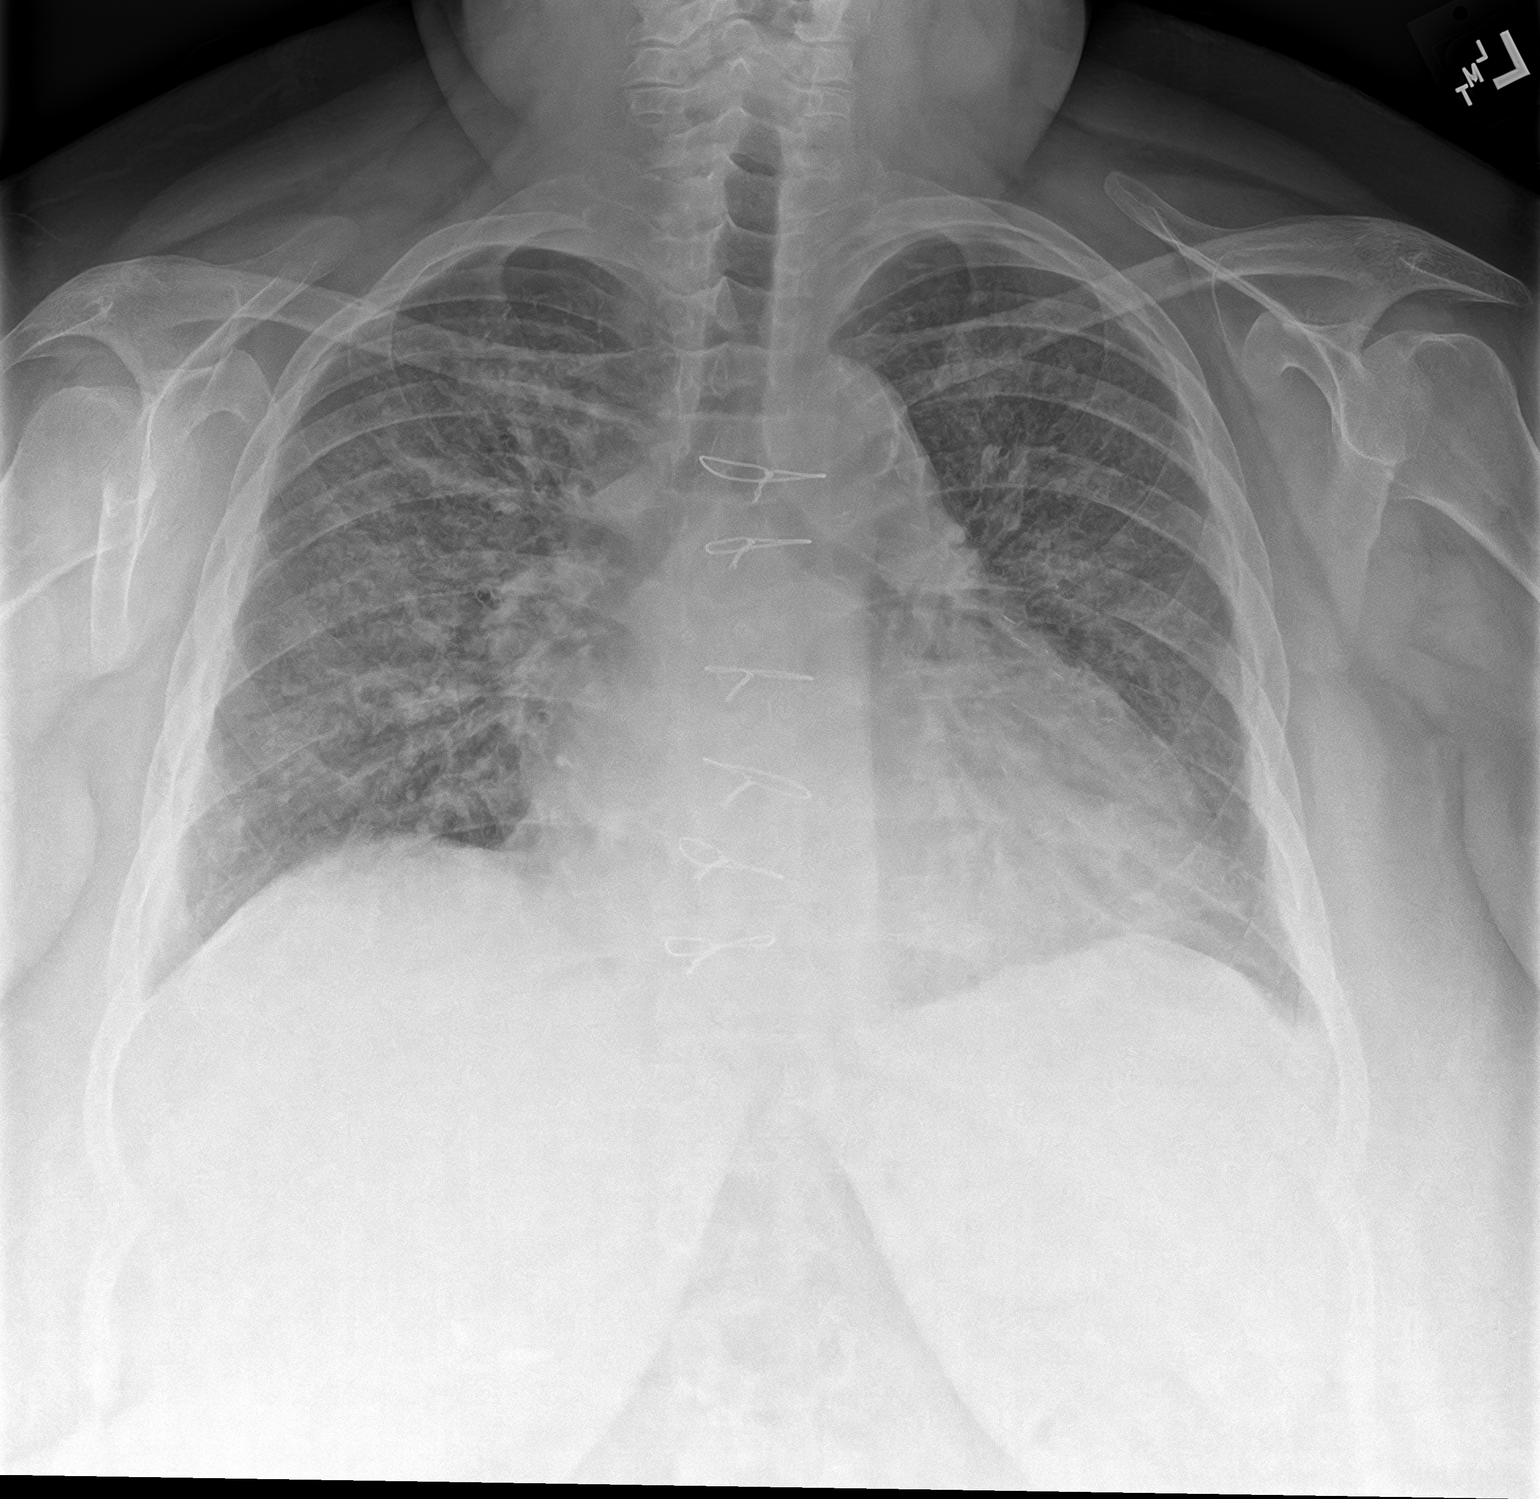

[chest lat]
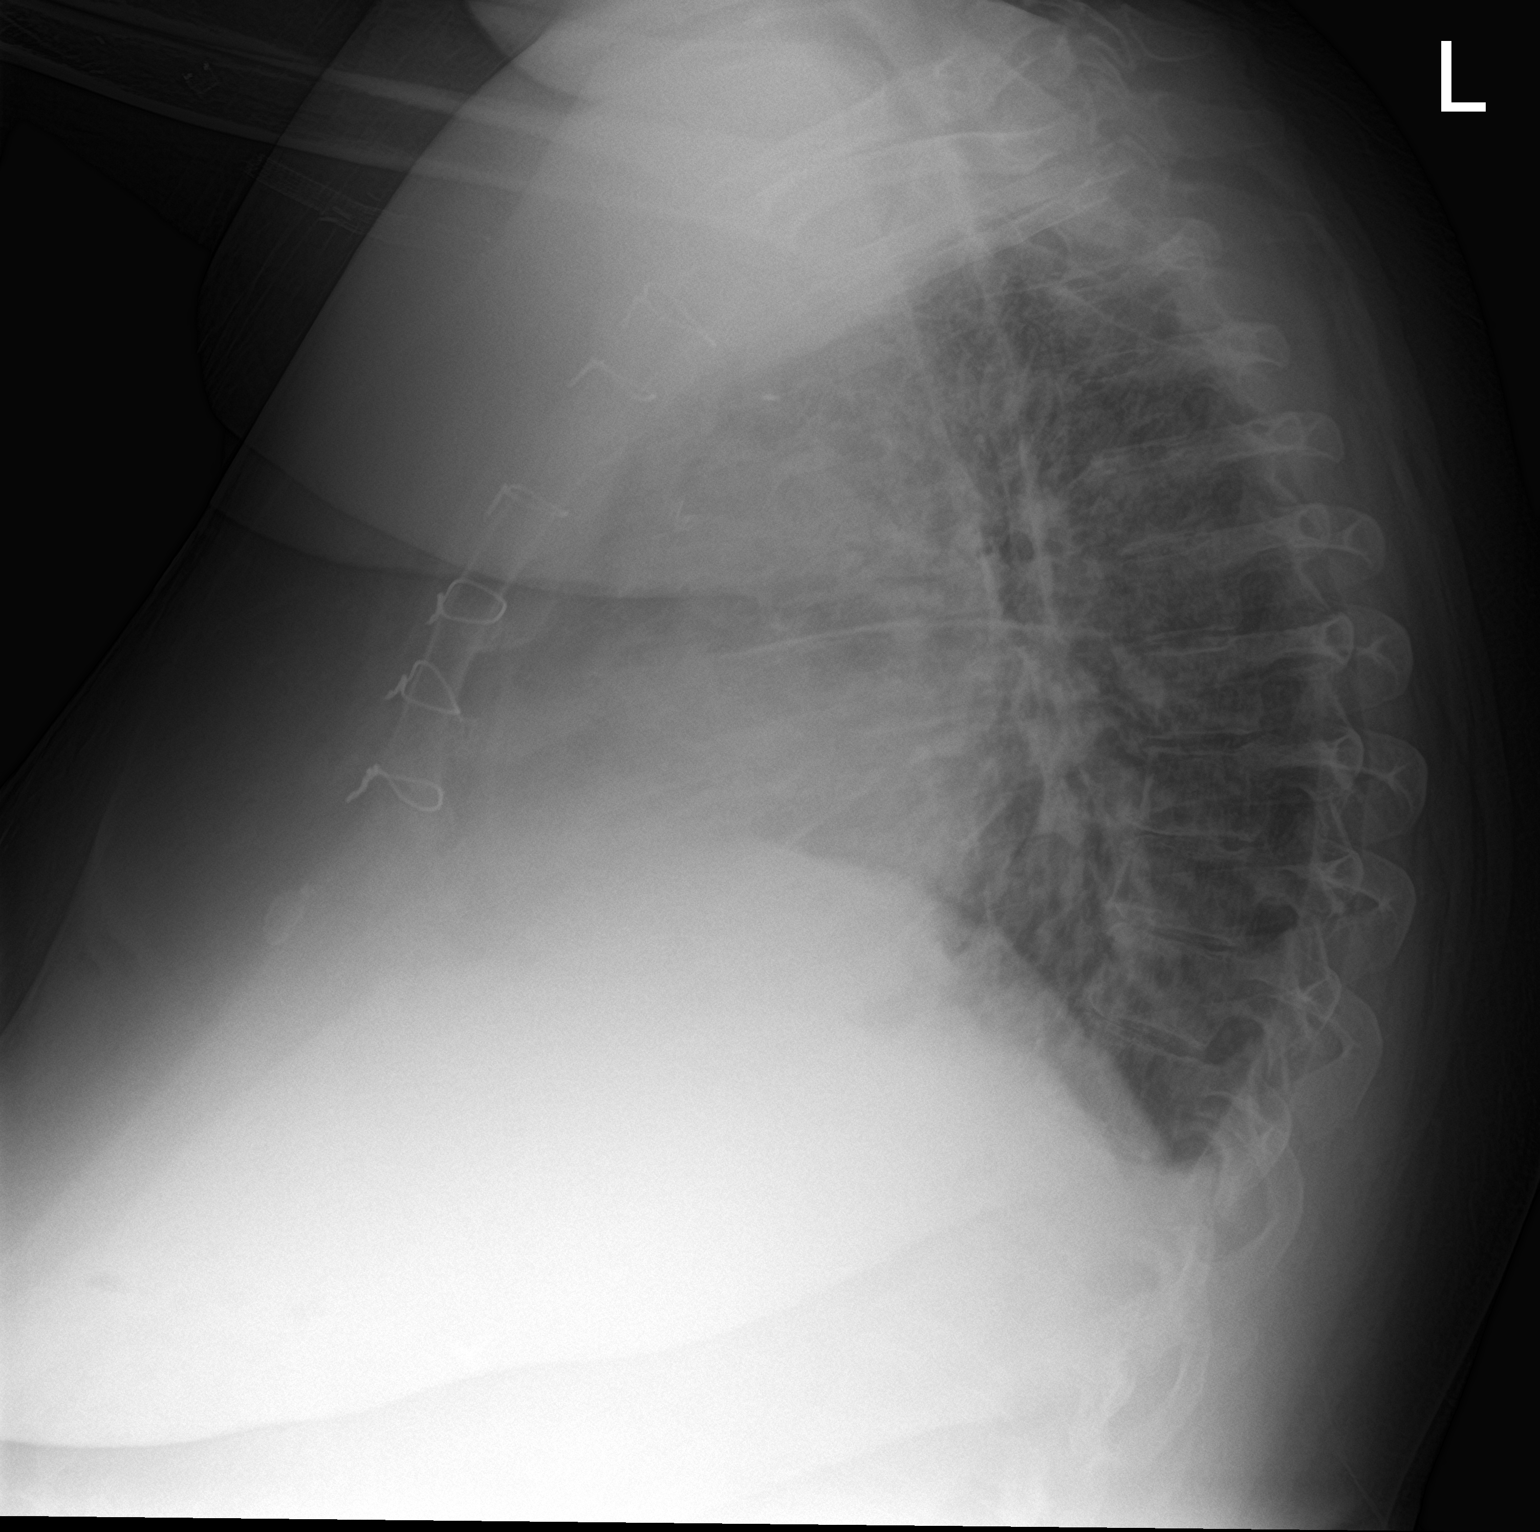

[2 of 2 positions shown; findings below may reference images not displayed]

FINDINGS: Mild cardiomegaly is noted with central pulmonary vascular
congestion. Probable bilateral pulmonary edema is noted. Sternotomy
wires are noted. No pneumothorax or pleural effusion is noted. Bony
thorax is unremarkable.
IMPRESSION: Mild cardiomegaly with central pulmonary vascular congestion and
probable bilateral pulmonary edema.

Aortic Atherosclerosis ([id number]-Q32.2).

## 2022-10-25 ENCOUNTER — Encounter (INDEPENDENT_AMBULATORY_CARE_PROVIDER_SITE_OTHER): Payer: Self-pay

## 2024-07-09 ENCOUNTER — Telehealth: Payer: Self-pay | Admitting: Sleep Medicine

## 2024-07-09 NOTE — Telephone Encounter (Signed)
 LVMTCB to schedule sleep consult.

## 2024-07-13 ENCOUNTER — Encounter: Payer: Self-pay | Admitting: Emergency Medicine

## 2024-07-13 ENCOUNTER — Other Ambulatory Visit: Payer: Self-pay

## 2024-07-13 ENCOUNTER — Emergency Department
Admission: EM | Admit: 2024-07-13 | Discharge: 2024-07-13 | Discharge status: Against Medical Advice | Attending: Emergency Medicine | Admitting: Emergency Medicine

## 2024-07-13 ENCOUNTER — Emergency Department

## 2024-07-13 DIAGNOSIS — R0789 Other chest pain: Secondary | ICD-10-CM | POA: Diagnosis not present

## 2024-07-13 DIAGNOSIS — R002 Palpitations: Secondary | ICD-10-CM | POA: Diagnosis present

## 2024-07-13 DIAGNOSIS — Z5321 Procedure and treatment not carried out due to patient leaving prior to being seen by health care provider: Secondary | ICD-10-CM | POA: Insufficient documentation

## 2024-07-13 LAB — CBC
HCT: 34.8 % — ABNORMAL LOW (ref 36.0–46.0)
Hemoglobin: 11 g/dL — ABNORMAL LOW (ref 12.0–15.0)
MCH: 31.1 pg (ref 26.0–34.0)
MCHC: 31.6 g/dL (ref 30.0–36.0)
MCV: 98.3 fL (ref 80.0–100.0)
Platelets: 196 K/uL (ref 150–400)
RBC: 3.54 MIL/uL — ABNORMAL LOW (ref 3.87–5.11)
RDW: 14.2 % (ref 11.5–15.5)
WBC: 6.8 K/uL (ref 4.0–10.5)
nRBC: 0 % (ref 0.0–0.2)

## 2024-07-13 LAB — PROTIME-INR
INR: 1.3 — ABNORMAL HIGH (ref 0.8–1.2)
Prothrombin Time: 16.4 s — ABNORMAL HIGH (ref 11.4–15.2)

## 2024-07-13 LAB — BASIC METABOLIC PANEL WITH GFR
Anion gap: 10 (ref 5–15)
BUN: 12 mg/dL (ref 6–20)
CO2: 28 mmol/L (ref 22–32)
Calcium: 8.9 mg/dL (ref 8.9–10.3)
Chloride: 96 mmol/L — ABNORMAL LOW (ref 98–111)
Creatinine, Ser: 2.59 mg/dL — ABNORMAL HIGH (ref 0.44–1.00)
GFR, Estimated: 21 mL/min — ABNORMAL LOW (ref >60)
Glucose, Bld: 179 mg/dL — ABNORMAL HIGH (ref 70–99)
Potassium: 3.3 mmol/L — ABNORMAL LOW (ref 3.5–5.1)
Sodium: 134 mmol/L — ABNORMAL LOW (ref 135–145)

## 2024-07-13 LAB — TROPONIN I (HIGH SENSITIVITY): Troponin I (High Sensitivity): 14 ng/L (ref <18)

## 2024-07-13 NOTE — ED Triage Notes (Signed)
 Patient to ED via [facility] from home for palpations. States it feels like some pressure on her chest. Started today. Wears 4L [place] at baseline. Dialysis pt- last treatment today. Hx afib  20 R forearm

## 2024-07-13 NOTE — ED Notes (Signed)
 Patient arrives at front desk with her son stating that she's feeling much better and wants to go home. Educated patient on possible seriousness of situation and how it could medically be a safety risk. Patient and son both verbalized understanding and told this RN that they would be following up with their Primary Care Provider. Patient appears to be in no distress and denies any chest pain, SOB, or palpitations at this time.

## 2024-07-24 ENCOUNTER — Encounter: Payer: Self-pay | Admitting: Sleep Medicine

## 2024-07-24 ENCOUNTER — Ambulatory Visit: Admitting: Sleep Medicine

## 2024-08-02 ENCOUNTER — Ambulatory Visit: Admitting: Sleep Medicine
